# Patient Record
Sex: Female | Born: 1990 | Race: Black or African American | Hispanic: No | Marital: Single | Smoking: Current some day smoker
Health system: Southern US, Community
[De-identification: ages and names within clinical notes are randomized; demographics above are authoritative.]

## PROBLEM LIST (undated history)

## (undated) DIAGNOSIS — F329 Major depressive disorder, single episode, unspecified: Secondary | ICD-10-CM

## (undated) DIAGNOSIS — F419 Anxiety disorder, unspecified: Secondary | ICD-10-CM

## (undated) DIAGNOSIS — F4310 Post-traumatic stress disorder, unspecified: Secondary | ICD-10-CM

## (undated) DIAGNOSIS — F32A Depression, unspecified: Secondary | ICD-10-CM

## (undated) HISTORY — PX: TUBAL LIGATION: SHX77

---
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
## 2009-01-12 ENCOUNTER — Emergency Department (HOSPITAL_COMMUNITY): Admission: EM | Admit: 2009-01-12 | Discharge: 2009-01-13 | Payer: Self-pay | Admitting: Emergency Medicine

## 2009-05-30 ENCOUNTER — Emergency Department (HOSPITAL_COMMUNITY): Admission: EM | Admit: 2009-05-30 | Discharge: 2009-05-31 | Payer: Self-pay | Admitting: Emergency Medicine

## 2010-01-24 ENCOUNTER — Emergency Department (HOSPITAL_COMMUNITY)
Admission: EM | Admit: 2010-01-24 | Discharge: 2010-01-24 | Payer: Self-pay | Source: Home / Self Care | Admitting: Emergency Medicine

## 2010-04-11 ENCOUNTER — Emergency Department (HOSPITAL_COMMUNITY): Payer: Self-pay

## 2010-04-11 ENCOUNTER — Emergency Department (HOSPITAL_COMMUNITY)
Admission: EM | Admit: 2010-04-11 | Discharge: 2010-04-12 | Disposition: A | Discharge status: Home / Self Care | Payer: Self-pay | Attending: Emergency Medicine | Admitting: Emergency Medicine

## 2010-04-11 DIAGNOSIS — R109 Unspecified abdominal pain: Secondary | ICD-10-CM | POA: Insufficient documentation

## 2010-04-11 LAB — URINALYSIS, ROUTINE W REFLEX MICROSCOPIC
Ketones, ur: NEGATIVE mg/dL
Leukocytes, UA: NEGATIVE
Nitrite: NEGATIVE
Protein, ur: NEGATIVE mg/dL
pH: 7 (ref 5.0–8.0)

## 2010-04-11 LAB — POCT PREGNANCY, URINE: Preg Test, Ur: NEGATIVE

## 2010-04-11 LAB — URINE MICROSCOPIC-ADD ON

## 2010-04-27 LAB — URINALYSIS, ROUTINE W REFLEX MICROSCOPIC
Glucose, UA: NEGATIVE mg/dL
Leukocytes, UA: NEGATIVE
pH: 8 (ref 5.0–8.0)

## 2010-04-27 LAB — URINE MICROSCOPIC-ADD ON

## 2010-05-11 LAB — WET PREP, GENITAL: Trich, Wet Prep: NONE SEEN

## 2010-05-11 LAB — URINALYSIS, ROUTINE W REFLEX MICROSCOPIC
Bilirubin Urine: NEGATIVE
Protein, ur: NEGATIVE mg/dL
Urobilinogen, UA: 0.2 mg/dL (ref 0.0–1.0)

## 2010-05-11 LAB — GC/CHLAMYDIA PROBE AMP, GENITAL
Chlamydia, DNA Probe: NEGATIVE
GC Probe Amp, Genital: NEGATIVE

## 2010-11-02 LAB — DIFFERENTIAL
Eosinophils Relative: 1
Lymphocytes Relative: 17
Lymphs Abs: 1.2
Neutro Abs: 5.5
Neutrophils Relative %: 74

## 2010-11-02 LAB — BASIC METABOLIC PANEL
BUN: 11
Calcium: 9.3
GFR calc non Af Amer: >60
Potassium: 3.5
Sodium: 134 — ABNORMAL LOW

## 2010-11-02 LAB — ETHANOL: Alcohol, Ethyl (B): <5

## 2010-11-02 LAB — CBC
HCT: 38.6
Platelets: 308
WBC: 7.4

## 2010-11-02 LAB — RAPID URINE DRUG SCREEN, HOSP PERFORMED
Cocaine: NOT DETECTED
Tetrahydrocannabinol: POSITIVE — AB

## 2010-11-04 LAB — URINALYSIS, ROUTINE W REFLEX MICROSCOPIC
Leukocytes, UA: NEGATIVE
Nitrite: NEGATIVE
Specific Gravity, Urine: 1.01
Urobilinogen, UA: 0.2
pH: 7

## 2010-11-04 LAB — URINE MICROSCOPIC-ADD ON

## 2010-11-04 LAB — PREGNANCY, URINE: Preg Test, Ur: NEGATIVE

## 2010-11-08 LAB — URINALYSIS, ROUTINE W REFLEX MICROSCOPIC
Glucose, UA: NEGATIVE
Ketones, ur: NEGATIVE
Protein, ur: >300 — AB
pH: 8.5 — ABNORMAL HIGH

## 2010-11-19 LAB — URINALYSIS, ROUTINE W REFLEX MICROSCOPIC
Glucose, UA: NEGATIVE
pH: 6

## 2010-11-22 LAB — WET PREP, GENITAL
WBC, Wet Prep HPF POC: NONE SEEN
Yeast Wet Prep HPF POC: NONE SEEN

## 2010-11-22 LAB — PREGNANCY, URINE: Preg Test, Ur: NEGATIVE

## 2010-11-22 LAB — URINALYSIS, ROUTINE W REFLEX MICROSCOPIC
Bilirubin Urine: NEGATIVE
Nitrite: NEGATIVE
Specific Gravity, Urine: 1.015
Urobilinogen, UA: 0.2

## 2010-11-22 LAB — URINE MICROSCOPIC-ADD ON

## 2011-08-03 ENCOUNTER — Emergency Department (HOSPITAL_COMMUNITY)
Admission: EM | Admit: 2011-08-03 | Discharge: 2011-08-03 | Disposition: A | Discharge status: Home / Self Care | Payer: Medicaid Other | Attending: Emergency Medicine | Admitting: Emergency Medicine

## 2011-08-03 ENCOUNTER — Encounter (HOSPITAL_COMMUNITY): Payer: Self-pay | Admitting: *Deleted

## 2011-08-03 DIAGNOSIS — B977 Papillomavirus as the cause of diseases classified elsewhere: Secondary | ICD-10-CM | POA: Insufficient documentation

## 2011-08-03 DIAGNOSIS — F17200 Nicotine dependence, unspecified, uncomplicated: Secondary | ICD-10-CM | POA: Insufficient documentation

## 2011-08-03 NOTE — ED Notes (Signed)
Vag d/c with odor, headache.

## 2011-08-03 NOTE — Discharge Instructions (Signed)
Human Papillomavirus  Human papillomavirus (HPV) is the most common sexually transmitted infection (STI) and is highly contagious. HPV infections cause genital warts and cancers to the outlet of the womb (cervix), birth canal (vagina), opening of the birth canal (vulva), and anus. There are over 100 types of HPV. Four types of HPV are responsible for causing 70% of all cervical cancers. Ninety percent of anal cancers and genital warts are caused by HPV. Unless you have wart-like lesions in the throat or genital warts that you can see or feel, HPV usually does not cause symptoms. Therefore, people can be infected for long periods and pass it on to others without knowing it.  HPV in pregnancy usually does not cause a problem for the mother or baby. If the mother has genital warts, the baby rarely gets infected. When the HPV infection is found to be pre-cancerous on the cervix, vagina, or vulva, the mother will be followed closely during the pregnancy. Any needed treatment will be done after the baby is born.  CAUSES     Having unprotected sex. HPV can be spread by oral, vaginal, or anal sexual activity.   Having several sex partners.   Having a sex partner who has other sex partners.   Having or having had another sexually transmitted infection.  SYMPTOMS     More than 90% of people carrying HPV cannot tell anything is wrong.   Wart-like lesions in the throat (from having oral sex).   Warts in the infected skin or mucous membranes.   Genital warts may itch, burn, or bleed.   Genital warts may be painful or bleed during sexual intercourse.  DIAGNOSIS     Genital warts are easily seen with the naked eye.   Currently, there is no FDA-approved test to detect HPV in males.   In females, a Pap test can show cells which are infected with HPV.   In females, a scope can be used to view the cervix (colposcopy). A colposcopy can be performed if the pelvic exam or Pap test is abnormal.    In females, a sample of tissue may be removed (biopsy) during the colposcopy.  TREATMENT     Treatment of genital warts can include:   Podophyllin lotion or gel.   Bichloroacetic acid (BCA) or trichloroacetic acid (TCA).   Podofilox solution or gel.   Imiquimod cream.   Interferon injections.   Use of a probe to apply extreme cold (cryotherapy).   Application of an intense beam of light (laser treatment).   Use of a probe to apply extreme heat (electrocautery).   Surgery.   HPV of the cervix, vagina, or vulva can be treated with:   Cryotherapy.   Laser treatment.   Electrocautery.   Surgery.  Your caregiver will follow you closely after you are treated. This is because the HPV can come back and may need treatment again.  HOME CARE INSTRUCTIONS     Follow your caregiver's instructions regarding medications, Pap tests, and follow-up exams.   Do not touch or scratch the warts.   Do not treat genital warts with medication used for treating hand warts.   Tell your sex partner about your infection because he or she may also need treatment.   Do not have sex while you are being treated.   After treatment, use condoms during sex to prevent future infections.   Have only 1 sex partner.   Have a sex partner who does not have other sex partners.     Use over-the-counter creams for itching or irritation as directed by your caregiver.   Use over-the-counter or prescription medicines for pain, discomfort, or fever as directed by your caregiver.   Do not douche or use tampons during treatment of HPV.  PREVENTION     Talk to your caregiver about getting the HPV vaccines. These vaccines prevent some HPV infections and cancers. It is recommended that the vaccine be given to males and females between the age of 9 and 26 years old. It will not work if you already have HPV and it is not recommended for pregnant women. The vaccines are not recommended for pregnant women.    Call your caregiver if you think you are pregnant and have the HPV.   A PAP test is done to screen for cervical cancer.   The first PAP test should be done at age 21.   Between ages 21 and 29, PAP tests are repeated every 2 years.   Beginning at age 30, you are advised to have a PAP test every 3 years as long as your past 3 PAP tests have been normal.   Some women have medical problems that increase the chance of getting cervical cancer. Talk to your caregiver about these problems. It is especially important to talk to your caregiver if a new problem develops soon after your last PAP test. In these cases, your caregiver may recommend more frequent screening and Pap tests.   The above recommendations are the same for women who have or have not gotten the vaccine for HPV (Human Papillomavirus).   If you had a hysterectomy for a problem that was not a cancer or a condition that could lead to cancer, then you no longer need Pap tests. However, even if you no longer need a PAP test, a regular exam is a good idea to make sure no other problems are starting.      If you are between ages 65 and 70, and you have had normal Pap tests going back 10 years, you no longer need Pap tests. However, even if you no longer need a PAP test, a regular exam is a good idea to make sure no other problems are starting.   If you have had past treatment for cervical cancer or a condition that could lead to cancer, you need Pap tests and screening for cancer for at least 20 years after your treatment.    If Pap tests have been discontinued, risk factors (such as a new sexual partner) need to be re-assessed to determine if screening should be resumed.   Some women may need screenings more often if they are at high risk for cervical cancer.  SEEK MEDICAL CARE IF:     The treated skin becomes red, swollen or painful.   You have an oral temperature above 102 F (38.9 C).   You feel generally ill.    You feel lumps or pimple-like projections in and around your genital area.   You develop bleeding of the vagina or the treatment area.   You develop painful sexual intercourse.  Document Released: 04/16/2003 Document Revised: 01/13/2011 Document Reviewed: 04/05/2007  ExitCare Patient Information 2012 [employer].

## 2011-08-05 NOTE — ED Provider Notes (Signed)
History     CSN: [id number]  Arrival date & time 08/03/11  1641   First MD Initiated Contact with Patient 08/03/11 1655      No chief complaint on file.   (Consider location/radiation/quality/duration/timing/severity/associated sxs/prior treatment) HPI Comments: Mercedes Reynolds presents for assistance with "bumps" she has developed on her genitals over the past month.  She is currently 2 months post partum.  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  She has concerns about this infection and would like to be treated for this.  She has not yet discussed this diagnosis with her gynecologist,  Was just told this result over the phone.  She denies having any vaginal pain or discharge,  Also denies painful urination,  Fevers,  Chills or abdominal pain.  She does report occasional headache.  She is not currently breastfeeding.    The history is provided by the patient.    History reviewed. No pertinent past medical history.  History reviewed. No pertinent past surgical history.  History reviewed. No pertinent family history.  History  Substance Use Topics  . Smoking status: Current Everyday Smoker  . Smokeless tobacco: Not on file  . Alcohol Use: Yes    OB History    Grav Para Term Preterm Abortions TAB SAB Ect Mult Living                  Review of Systems  Constitutional: Negative for fever.  HENT: Negative for congestion, sore throat and neck pain.   Eyes: Negative.   Respiratory: Negative for chest tightness and shortness of breath.   Cardiovascular: Negative for chest pain.  Gastrointestinal: Negative for nausea and abdominal pain.  Genitourinary: Negative.  Negative for dysuria, vaginal discharge and vaginal pain.  Musculoskeletal: Negative for joint swelling and arthralgias.  Skin: Positive for rash. Negative for wound.  Neurological: Negative for  dizziness, weakness, light-headedness, numbness and headaches.  Hematological: Negative.   Psychiatric/Behavioral: Negative.     Allergies  Review of patient's allergies indicates no known allergies.  Home Medications  No current outpatient prescriptions on file.  BP 120/75  Pulse 90  Temp 97.9 F (36.6 C) (Oral)  Resp 20  Ht 5' 3.5" (1.613 m)  Wt 122 lb (55.339 kg)  BMI 21.27 kg/m2  SpO2 100%  LMP 07/26/2011  Physical Exam  Constitutional: She appears well-developed and well-nourished. No distress.  HENT:  Head: Normocephalic.  Neck: Neck supple.  Cardiovascular: Normal rate.   Pulmonary/Chest: Effort normal.  Abdominal: Soft. She exhibits no distension. There is no tenderness.  Genitourinary:       Speculum exam deferred.  Several raised,  Flesh colored lesions on labia majora consistent with HPV.  Musculoskeletal: She exhibits no edema.    ED Course  Procedures (including critical care time)  Labs Reviewed - No data to display No results found.   1. HPV (human papilloma virus) infection     Nursing notes reviewed.  Pt denies vaginal dc as symptom today.  MDM  Pt basically here for information regarding her recent diagnosis of HPV. No labs or further testing deemed necessary at this time.  Time was spent with patient educating regarding her diagnosis and options for treatment.  She was encouraged to f/u with her gynecologist for further assistance with tx options.  She will call for appt.  Burgess Amor, [place] 08/05/11 1539

## 2011-08-15 NOTE — ED Provider Notes (Signed)
Medical screening examination/treatment/procedure(s) were performed by non-physician practitioner and as supervising physician I was immediately available for consultation/collaboration.  Donnetta Hutching, MD 08/15/11 [phone]

## 2013-07-25 ENCOUNTER — Emergency Department (HOSPITAL_COMMUNITY)
Admission: EM | Admit: 2013-07-25 | Discharge: 2013-07-25 | Disposition: A | Discharge status: Home / Self Care | Payer: Medicaid Other | Attending: Emergency Medicine | Admitting: Emergency Medicine

## 2013-07-25 ENCOUNTER — Encounter (HOSPITAL_COMMUNITY): Payer: Self-pay | Admitting: Emergency Medicine

## 2013-07-25 DIAGNOSIS — Z202 Contact with and (suspected) exposure to infections with a predominantly sexual mode of transmission: Secondary | ICD-10-CM

## 2013-07-25 DIAGNOSIS — Z113 Encounter for screening for infections with a predominantly sexual mode of transmission: Secondary | ICD-10-CM | POA: Insufficient documentation

## 2013-07-25 DIAGNOSIS — N390 Urinary tract infection, site not specified: Secondary | ICD-10-CM | POA: Insufficient documentation

## 2013-07-25 DIAGNOSIS — F411 Generalized anxiety disorder: Secondary | ICD-10-CM | POA: Insufficient documentation

## 2013-07-25 DIAGNOSIS — F17200 Nicotine dependence, unspecified, uncomplicated: Secondary | ICD-10-CM | POA: Insufficient documentation

## 2013-07-25 HISTORY — DX: Anxiety disorder, unspecified: F41.9

## 2013-07-25 LAB — URINALYSIS, ROUTINE W REFLEX MICROSCOPIC
BILIRUBIN URINE: NEGATIVE
GLUCOSE, UA: NEGATIVE mg/dL
Ketones, ur: NEGATIVE mg/dL
Nitrite: NEGATIVE
PROTEIN: 100 mg/dL — AB
SPECIFIC GRAVITY, URINE: 1.025 (ref 1.005–1.030)
Urobilinogen, UA: 1 mg/dL (ref 0.0–1.0)
pH: 6.5 (ref 5.0–8.0)

## 2013-07-25 LAB — URINE MICROSCOPIC-ADD ON

## 2013-07-25 MED ORDER — PHENAZOPYRIDINE HCL 100 MG PO TABS: 100.0000 mg | ORAL_TABLET | Freq: Three times a day (TID) | ORAL | Status: DC

## 2013-07-25 MED ORDER — CEPHALEXIN 500 MG PO CAPS
500.0000 mg | ORAL_CAPSULE | Freq: Once | ORAL | Status: AC
  Administered 2013-07-25: 500 mg via ORAL
  Filled 2013-07-25: qty 1

## 2013-07-25 MED ORDER — ONDANSETRON HCL 4 MG PO TABS
4.0000 mg | ORAL_TABLET | Freq: Once | ORAL | Status: AC
  Administered 2013-07-25: 4 mg via ORAL
  Filled 2013-07-25: qty 1

## 2013-07-25 MED ORDER — CEPHALEXIN 500 MG PO CAPS: 500.0000 mg | ORAL_CAPSULE | Freq: Four times a day (QID) | ORAL | Status: DC

## 2013-07-25 MED ORDER — PHENAZOPYRIDINE HCL 100 MG PO TABS
200.0000 mg | ORAL_TABLET | Freq: Once | ORAL | Status: AC
  Administered 2013-07-25: 200 mg via ORAL
  Filled 2013-07-25: qty 2

## 2013-07-25 NOTE — ED Provider Notes (Signed)
CSN: [id number]     Arrival date & time 07/25/13  1813 History   First MD Initiated Contact with Patient 07/25/13 1837     Chief Complaint  Patient presents with  . Hematuria     (Consider location/radiation/quality/duration/timing/severity/associated sxs/prior Treatment) Patient is a 23 y.o. female presenting with hematuria. The history is provided by the patient.  Hematuria This is a new problem. The current episode started in the past 7 days. The problem occurs intermittently. The problem has been unchanged. Associated symptoms include abdominal pain and urinary symptoms. Pertinent negatives include no arthralgias, chest pain, chills, coughing, nausea, neck pain or vomiting. Nothing aggravates the symptoms. She has tried nothing for the symptoms. The treatment provided no relief.    Past Medical History  Diagnosis Date  . Anxiety    History reviewed. No pertinent past surgical history. Family History  Problem Relation Age of Onset  . Stroke Other   . Diabetes Other    History  Substance Use Topics  . Smoking status: Current Every Day Smoker -- 0.35 packs/day for 6 years    Types: Cigarettes  . Smokeless tobacco: Never Used  . Alcohol Use: Yes   OB History   Grav Para Term Preterm Abortions TAB SAB Ect Mult Living   2 2 2       2      Review of Systems  Constitutional: Negative for chills and activity change.       All ROS Neg except as noted in HPI  HENT: Negative for nosebleeds.   Eyes: Negative for photophobia and discharge.  Respiratory: Negative for cough, shortness of breath and wheezing.   Cardiovascular: Negative for chest pain and palpitations.  Gastrointestinal: Positive for abdominal pain. Negative for nausea, vomiting and blood in stool.  Genitourinary: Positive for dysuria and hematuria. Negative for frequency.  Musculoskeletal: Negative for arthralgias, back pain and neck pain.  Skin: Negative.   Neurological: Negative for dizziness, seizures and speech  difficulty.  Psychiatric/Behavioral: Negative for hallucinations and confusion. The patient is nervous/anxious.       Allergies  Review of patient's allergies indicates no known allergies.  Home Medications   Prior to Admission medications   Not on File   BP 120/71  Pulse 61  Temp(Src) 98.3 F (36.8 C) (Oral)  Resp 18  Ht 5' 3.5" (1.613 m)  Wt 106 lb 6.4 oz (48.263 kg)  BMI 18.55 kg/m2  SpO2 100%  LMP 07/21/2013 Physical Exam  Nursing note and vitals reviewed. Constitutional: She is oriented to person, place, and time. She appears well-developed and well-nourished.  Non-toxic appearance.  HENT:  Head: Normocephalic.  Right Ear: Tympanic membrane and external ear normal.  Left Ear: Tympanic membrane and external ear normal.  Eyes: EOM and lids are normal. Pupils are equal, round, and reactive to light.  Neck: Normal range of motion. Neck supple. Carotid bruit is not present.  Cardiovascular: Normal rate, regular rhythm, normal heart sounds, intact distal pulses and normal pulses.   Pulmonary/Chest: Breath sounds normal. No respiratory distress.  Abdominal: Soft. Bowel sounds are normal. There is tenderness in the suprapubic area. There is no guarding and no CVA tenderness.    Musculoskeletal: Normal range of motion.  Lymphadenopathy:       Head (right side): No submandibular adenopathy present.       Head (left side): No submandibular adenopathy present.    She has no cervical adenopathy.  Neurological: She is alert and oriented to person, place, and time. She has  normal strength. No cranial nerve deficit or sensory deficit.  Skin: Skin is warm and dry.  Psychiatric: She has a normal mood and affect. Her speech is normal.    ED Course  Procedures (including critical care time) Labs Review Labs Reviewed  URINALYSIS, ROUTINE W REFLEX MICROSCOPIC - Abnormal; Notable for the following:    Hgb urine dipstick LARGE (*)    Protein, ur 100 (*)    Leukocytes, UA SMALL (*)     All other components within normal limits  URINE MICROSCOPIC-ADD ON - Abnormal; Notable for the following:    Squamous Epithelial / LPF MANY (*)    Bacteria, UA FEW (*)    All other components within normal limits  GC/CHLAMYDIA PROBE AMP  HIV ANTIBODY (ROUTINE TESTING)  RPR    Imaging Review No results found.   EKG Interpretation None      MDM Urinalysis reveals a clear yellow specimen with a specific gravity 1.025. There is a large hemoglobin and 100 mg per decaliter of protein present. There is a small leukocyte esterase. There are too many to count white blood cells and 21-50 red blood cells per high-powered field. There are many epithelial cells present. The urine will be sent for culture.  The patient requests to have her urine tested for sexually transmitted diseases and also to have a battery of sexually transmitted disease testing done. The patient had blood sent to the lab for HIV, RPR. The lab will also test the urine for gonorrhea and Chlamydia.  The plan at this time is for the patient to be treated with Keflex 500 mg 4 times a day and a urinary analgesic.    Final diagnoses:  None    *I have reviewed nursing notes, vital signs, and all appropriate lab and imaging results for this patient.Kathie Dike**    Hobson M Bryant, PA-C 07/25/13 1929

## 2013-07-25 NOTE — ED Provider Notes (Signed)
Medical screening examination/treatment/procedure(s) were performed by non-physician practitioner and as supervising physician I was immediately available for consultation/collaboration.   EKG Interpretation None      Devoria AlbeIva Knapp, MD, Armando GangFACEP   Ward GivensIva L Knapp, MD 07/25/13 [phone]

## 2013-07-25 NOTE — ED Notes (Signed)
Please call any lab results to [phone].

## 2013-07-25 NOTE — Discharge Instructions (Signed)

## 2013-07-25 NOTE — ED Notes (Signed)
Per patient used a douche after period on Monday. Patient states since then urine dark in color, with foul odor. Patient reports today frequency with little urination and noting blood today after wiping. Patient denies any pain but states "It feels like I'm going to have pain when I stop urinating but it does not."

## 2013-07-26 LAB — RPR

## 2013-07-26 LAB — HIV ANTIBODY (ROUTINE TESTING W REFLEX): HIV 1&2 Ab, 4th Generation: NONREACTIVE

## 2013-07-27 LAB — GC/CHLAMYDIA PROBE AMP
CT PROBE, AMP APTIMA: NEGATIVE
GC PROBE AMP APTIMA: NEGATIVE

## 2013-07-28 LAB — URINE CULTURE
Colony Count: 50000
SPECIAL REQUESTS: NORMAL

## 2013-07-29 ENCOUNTER — Telehealth (HOSPITAL_BASED_OUTPATIENT_CLINIC_OR_DEPARTMENT_OTHER): Payer: Self-pay | Admitting: Emergency Medicine

## 2013-07-29 NOTE — Progress Notes (Signed)
ED Antimicrobial Stewardship Positive Culture Follow Up   Mercedes Reynolds is an 23 y.o. female who [facility] with a chief complaint of  Chief Complaint  Patient presents with  . Hematuria    Recent Results (from the past 720 hour(s))  GC/CHLAMYDIA PROBE AMP     Status: None   Collection Time    07/25/13  6:50 PM      Result Value Ref Range Status   CT Probe RNA NEGATIVE  NEGATIVE Final   GC Probe RNA NEGATIVE  NEGATIVE Final   Comment: (NOTE)                                                                                               **Normal Reference Range: Negative**          Assay performed using the Gen-Probe APTIMA COMBO2 (R) Assay.     Acceptable specimen types for this assay include APTIMA Swabs (Unisex,     endocervical, urethral, or vaginal), first void urine, and ThinPrep     liquid based cytology samples.     Performed at [facility]  URINE CULTURE     Status: None   Collection Time    07/25/13  7:36 PM      Result Value Ref Range Status   Specimen Description URINE, CLEAN CATCH   Final   Special Requests Normal   Final   Culture  Setup Time     Final   Value: 07/26/2013 14:24     Performed at [facility]   Colony Count     Final   Value: 50,000 COLONIES/ML     Performed at [facility]   Culture     Final   Value: ESCHERICHIA COLI     Performed at [facility]   Report Status 07/28/2013 FINAL   Final   Organism ID, Bacteria ESCHERICHIA COLI   Final    [x]  Treated with Cephalexin, organism may be resistant to prescribed antimicrobial []  Patient discharged originally without antimicrobial agent and treatment is now indicated  Recommendation: Perform symptom check. If symptoms improving, complete Cephalexin therapy. If symptoms persist/worsening, start Ciprofloxacin 500mg  PO BID x 3 days.  ED Provider: Fayrene HelperBowie Tran, PA-C   Cleon DewDulaney, Clarence Robert 07/29/2013, 3:43 PM Infectious Diseases  Pharmacist Phone# [phone]

## 2013-12-09 ENCOUNTER — Encounter (HOSPITAL_COMMUNITY): Payer: Self-pay | Admitting: Emergency Medicine

## 2013-12-19 ENCOUNTER — Encounter (HOSPITAL_COMMUNITY): Payer: Self-pay | Admitting: *Deleted

## 2013-12-19 ENCOUNTER — Emergency Department (HOSPITAL_COMMUNITY)
Admission: EM | Admit: 2013-12-19 | Discharge: 2013-12-19 | Disposition: A | Discharge status: Home / Self Care | Payer: Medicaid Other | Attending: Emergency Medicine | Admitting: Emergency Medicine

## 2013-12-19 DIAGNOSIS — R110 Nausea: Secondary | ICD-10-CM | POA: Insufficient documentation

## 2013-12-19 DIAGNOSIS — Z8659 Personal history of other mental and behavioral disorders: Secondary | ICD-10-CM | POA: Insufficient documentation

## 2013-12-19 DIAGNOSIS — B9689 Other specified bacterial agents as the cause of diseases classified elsewhere: Secondary | ICD-10-CM

## 2013-12-19 DIAGNOSIS — Z720 Tobacco use: Secondary | ICD-10-CM | POA: Insufficient documentation

## 2013-12-19 DIAGNOSIS — N390 Urinary tract infection, site not specified: Secondary | ICD-10-CM

## 2013-12-19 DIAGNOSIS — Z3202 Encounter for pregnancy test, result negative: Secondary | ICD-10-CM | POA: Insufficient documentation

## 2013-12-19 DIAGNOSIS — N760 Acute vaginitis: Secondary | ICD-10-CM | POA: Insufficient documentation

## 2013-12-19 HISTORY — DX: Major depressive disorder, single episode, unspecified: F32.9

## 2013-12-19 HISTORY — DX: Depression, unspecified: F32.A

## 2013-12-19 LAB — WET PREP, GENITAL
Trich, Wet Prep: NONE SEEN
Yeast Wet Prep HPF POC: NONE SEEN

## 2013-12-19 LAB — URINE MICROSCOPIC-ADD ON

## 2013-12-19 LAB — URINALYSIS, ROUTINE W REFLEX MICROSCOPIC
Bilirubin Urine: NEGATIVE
Glucose, UA: NEGATIVE mg/dL
Hgb urine dipstick: NEGATIVE
Ketones, ur: NEGATIVE mg/dL
NITRITE: NEGATIVE
PH: 7 (ref 5.0–8.0)
Protein, ur: NEGATIVE mg/dL
SPECIFIC GRAVITY, URINE: 1.01 (ref 1.005–1.030)
Urobilinogen, UA: 0.2 mg/dL (ref 0.0–1.0)

## 2013-12-19 LAB — POC URINE PREG, ED: PREG TEST UR: NEGATIVE

## 2013-12-19 MED ORDER — ONDANSETRON HCL 4 MG PO TABS
4.0000 mg | ORAL_TABLET | Freq: Once | ORAL | Status: AC
  Administered 2013-12-19: 4 mg via ORAL
  Filled 2013-12-19: qty 1

## 2013-12-19 MED ORDER — CEPHALEXIN 500 MG PO CAPS
500.0000 mg | ORAL_CAPSULE | Freq: Once | ORAL | Status: AC
  Administered 2013-12-19: 500 mg via ORAL
  Filled 2013-12-19: qty 1

## 2013-12-19 MED ORDER — METRONIDAZOLE 500 MG PO TABS
500.0000 mg | ORAL_TABLET | Freq: Once | ORAL | Status: AC
Start: 2013-12-19 — End: 2013-12-19
  Administered 2013-12-19: 500 mg via ORAL
  Filled 2013-12-19: qty 1

## 2013-12-19 MED ORDER — METRONIDAZOLE 500 MG PO TABS
500.0000 mg | ORAL_TABLET | Freq: Two times a day (BID) | ORAL | Status: DC
Start: 2013-12-19 — End: 2015-03-24

## 2013-12-19 MED ORDER — CEPHALEXIN 500 MG PO CAPS: 500.0000 mg | ORAL_CAPSULE | Freq: Four times a day (QID) | ORAL | Status: DC

## 2013-12-19 NOTE — ED Notes (Addendum)
Vaginal d/c with odor  For 1 month   Painful intercourse

## 2013-12-19 NOTE — Discharge Instructions (Signed)
Bacterial Vaginosis °Bacterial vaginosis is a vaginal infection that occurs when the normal balance of bacteria in the vagina is disrupted. It results from an overgrowth of certain bacteria. This is the most common vaginal infection in women of childbearing age. Treatment is important to prevent complications, especially in pregnant women, as it can cause a premature delivery. °CAUSES  °Bacterial vaginosis is caused by an increase in harmful bacteria that are normally present in smaller amounts in the vagina. Several different kinds of bacteria can cause bacterial vaginosis. However, the reason that the condition develops is not fully understood. °RISK FACTORS °Certain activities or behaviors can put you at an increased risk of developing bacterial vaginosis, including: °· Having a new sex partner or multiple sex partners. °· Douching. °· Using an intrauterine device (IUD) for contraception. °Women do not get bacterial vaginosis from toilet seats, bedding, swimming pools, or contact with objects around them. °SIGNS AND SYMPTOMS  °Some women with bacterial vaginosis have no signs or symptoms. Common symptoms include: °· Grey vaginal discharge. °· A fishlike odor with discharge, especially after sexual intercourse. °· Itching or burning of the vagina and vulva. °· Burning or pain with urination. °DIAGNOSIS  °Your health care provider will take a medical history and examine the vagina for signs of bacterial vaginosis. A sample of vaginal fluid may be taken. Your health care provider will look at this sample under a microscope to check for bacteria and abnormal cells. A vaginal pH test may also be done.  °TREATMENT  °Bacterial vaginosis may be treated with antibiotic medicines. These may be given in the form of a pill or a vaginal cream. A second round of antibiotics may be prescribed if the condition comes back after treatment.  °HOME CARE INSTRUCTIONS  °· Only take over-the-counter or prescription medicines as  directed by your health care provider. °· If antibiotic medicine was prescribed, take it as directed. Make sure you finish it even if you start to feel better. °· Do not have sex until treatment is completed. °· Tell all sexual partners that you have a vaginal infection. They should see their health care provider and be treated if they have problems, such as a mild rash or itching. °· Practice safe sex by using condoms and only having one sex partner. °SEEK MEDICAL CARE IF:  °· Your symptoms are not improving after 3 days of treatment. °· You have increased discharge or pain. °· You have a fever. °MAKE SURE YOU:  °· Understand these instructions. °· Will watch your condition. °· Will get help right away if you are not doing well or get worse. °FOR MORE INFORMATION  °Centers for Disease Control and Prevention, Division of STD Prevention: www.cdc.gov/std °American Sexual Health Association (ASHA): www.ashastd.org  °Document Released: 01/24/2005 Document Revised: 11/14/2012 Document Reviewed: 09/05/2012 °ExitCare® Patient Information ©2015 [employer]. This information is not intended to replace advice given to you by your health care provider. Make sure you discuss any questions you have with your health care provider. ° °Urinary Tract Infection °A urinary tract infection (UTI) can occur any place along the urinary tract. The tract includes the kidneys, ureters, bladder, and urethra. A type of germ called bacteria often causes a UTI. UTIs are often helped with antibiotic medicine.  °HOME CARE  °· If given, take antibiotics as told by your doctor. Finish them even if you start to feel better. °· Drink enough fluids to keep your pee (urine) clear or pale yellow. °· Avoid tea, drinks with caffeine,   and bubbly (carbonated) drinks. °· Pee often. Avoid holding your pee in for a long time. °· Pee before and after having sex (intercourse). °· Wipe from front to back after you poop (bowel movement) if you are a woman. Use  each tissue only once. °GET HELP RIGHT AWAY IF:  °· You have back pain. °· You have lower belly (abdominal) pain. °· You have chills. °· You feel sick to your stomach (nauseous). °· You throw up (vomit). °· Your burning or discomfort with peeing does not go away. °· You have a fever. °· Your symptoms are not better in 3 days. °MAKE SURE YOU:  °· Understand these instructions. °· Will watch your condition. °· Will get help right away if you are not doing well or get worse. °Document Released: 07/13/2007 Document Revised: 10/19/2011 Document Reviewed: 08/25/2011 °ExitCare® Patient Information ©2015 [employer]. This information is not intended to replace advice given to you by your health care provider. Make sure you discuss any questions you have with your health care provider. ° °

## 2013-12-19 NOTE — ED Provider Notes (Signed)
CSN: [id number]     Arrival date & time 12/19/13  1813 History   First MD Initiated Contact with Patient 12/19/13 1925     Chief Complaint  Patient presents with  . Vaginal Discharge     (Consider location/radiation/quality/duration/timing/severity/associated sxs/prior Treatment) Patient is a 23 y.o. female presenting with vaginal discharge. The history is provided by the patient.  Vaginal Discharge Quality:  White Severity:  Moderate Onset quality:  Gradual Duration:  1 month Timing:  Intermittent Progression:  Worsening Chronicity:  Recurrent Context: not recent antibiotic use   Relieved by:  Nothing Ineffective treatments:  None tried Associated symptoms: dyspareunia and nausea   Associated symptoms: no abdominal pain, no dysuria, no fever, no urinary incontinence, no vaginal itching and no vomiting   Risk factors: no immunosuppression     Past Medical History  Diagnosis Date  . Anxiety   . Depression    History reviewed. No pertinent past surgical history. Family History  Problem Relation Age of Onset  . Stroke Other   . Diabetes Other    History  Substance Use Topics  . Smoking status: Current Every Day Smoker -- 0.35 packs/day for 6 years    Types: Cigarettes  . Smokeless tobacco: Never Used  . Alcohol Use: Yes   OB History    Gravida Para Term Preterm AB TAB SAB Ectopic Multiple Living   2 2 2       2      Review of Systems  Constitutional: Negative for fever and activity change.       All ROS Neg except as noted in HPI  Eyes: Negative for photophobia and discharge.  Respiratory: Negative for cough, shortness of breath and wheezing.   Cardiovascular: Negative for chest pain and palpitations.  Gastrointestinal: Positive for nausea. Negative for vomiting, abdominal pain and blood in stool.  Genitourinary: Positive for vaginal discharge and dyspareunia. Negative for bladder incontinence, dysuria, frequency and hematuria.  Musculoskeletal: Negative for  back pain, arthralgias and neck pain.  Skin: Negative.   Neurological: Negative for dizziness, seizures and speech difficulty.  Psychiatric/Behavioral: Negative for hallucinations and confusion.      Allergies  Review of patient's allergies indicates no known allergies.  Home Medications   Prior to Admission medications   Medication Sig Start Date End Date Taking? Authorizing Provider  cephALEXin (KEFLEX) 500 MG capsule Take 1 capsule (500 mg total) by mouth 4 (four) times daily. Patient not taking: Reported on 12/19/2013 07/25/13   Kathie DikeHobson M Alianny Toelle, PA-C  phenazopyridine (PYRIDIUM) 100 MG tablet Take 1 tablet (100 mg total) by mouth 3 (three) times daily. Patient not taking: Reported on 12/19/2013 07/25/13   Kathie DikeHobson M Shadrick Senne, PA-C   BP 131/81 mmHg  Pulse 77  Temp(Src) 98.4 F (36.9 C) (Oral)  Resp 18  Ht 5\' 3"  (1.6 m)  Wt 106 lb (48.081 kg)  BMI 18.78 kg/m2  SpO2 100%  LMP 11/19/2013 Physical Exam  Constitutional: She is oriented to person, place, and time. She appears well-developed and well-nourished.  Non-toxic appearance.  HENT:  Head: Normocephalic.  Right Ear: Tympanic membrane and external ear normal.  Left Ear: Tympanic membrane and external ear normal.  Eyes: EOM and lids are normal. Pupils are equal, round, and reactive to light.  Neck: Normal range of motion. Neck supple. Carotid bruit is not present.  Cardiovascular: Normal rate, regular rhythm, normal heart sounds, intact distal pulses and normal pulses.   Pulmonary/Chest: Breath sounds normal. No respiratory distress.  Abdominal: Soft. Bowel sounds  are normal. There is no tenderness. There is no guarding. Hernia confirmed negative in the right inguinal area and confirmed negative in the left inguinal area.  Genitourinary: There is no rash, tenderness or lesion on the right labia. There is no rash, tenderness or lesion on the left labia. Cervix exhibits discharge and friability. Right adnexum displays no mass and no  tenderness. Left adnexum displays no mass and no tenderness. No signs of injury around the vagina. Vaginal discharge found.  Musculoskeletal: Normal range of motion.  Lymphadenopathy:       Head (right side): No submandibular adenopathy present.       Head (left side): No submandibular adenopathy present.    She has no cervical adenopathy.       Right: No inguinal adenopathy present.       Left: No inguinal adenopathy present.  Neurological: She is alert and oriented to person, place, and time. She has normal strength. No cranial nerve deficit or sensory deficit.  Skin: Skin is warm and dry.  Psychiatric: She has a normal mood and affect. Her speech is normal.  Nursing note and vitals reviewed.   ED Course  Procedures (including critical care time) Labs Review Labs Reviewed - No data to display  Imaging Review No results found.   EKG Interpretation None      MDM  UA reveals uti.  Wet prep  Suggest b. Vaginosis. Plan -  Rx for keflex, flagyl, and pyridium. STD panel sent to lab. [facility]   Final diagnoses:  Bacterial vaginosis  UTI (lower urinary tract infection)    *I have reviewed nursing notes, vital signs, and all appropriate lab and imaging results for this patient.[address]**    Laiyla Slagel M Petar Mucci, PA-C 12/21/13 1635  Benny LennertJoseph L Zammit, MD 12/22/13 0900

## 2013-12-20 LAB — HIV ANTIBODY (ROUTINE TESTING W REFLEX): HIV 1&2 Ab, 4th Generation: NONREACTIVE

## 2013-12-20 LAB — RPR

## 2013-12-21 LAB — URINE CULTURE
CULTURE: NO GROWTH
Colony Count: NO GROWTH
Special Requests: NORMAL

## 2013-12-21 LAB — GC/CHLAMYDIA PROBE AMP
CT PROBE, AMP APTIMA: NEGATIVE
GC Probe RNA: NEGATIVE

## 2015-03-24 ENCOUNTER — Emergency Department (HOSPITAL_COMMUNITY)
Admission: EM | Admit: 2015-03-24 | Discharge: 2015-03-24 | Disposition: A | Discharge status: Home / Self Care | Payer: Medicaid Other | Attending: Emergency Medicine | Admitting: Emergency Medicine

## 2015-03-24 ENCOUNTER — Encounter (HOSPITAL_COMMUNITY): Payer: Self-pay | Admitting: *Deleted

## 2015-03-24 DIAGNOSIS — N390 Urinary tract infection, site not specified: Secondary | ICD-10-CM | POA: Diagnosis not present

## 2015-03-24 DIAGNOSIS — Z3202 Encounter for pregnancy test, result negative: Secondary | ICD-10-CM | POA: Diagnosis not present

## 2015-03-24 DIAGNOSIS — M545 Low back pain: Secondary | ICD-10-CM | POA: Diagnosis present

## 2015-03-24 DIAGNOSIS — Z8659 Personal history of other mental and behavioral disorders: Secondary | ICD-10-CM | POA: Insufficient documentation

## 2015-03-24 DIAGNOSIS — F17210 Nicotine dependence, cigarettes, uncomplicated: Secondary | ICD-10-CM | POA: Diagnosis not present

## 2015-03-24 DIAGNOSIS — Z792 Long term (current) use of antibiotics: Secondary | ICD-10-CM | POA: Diagnosis not present

## 2015-03-24 LAB — WET PREP, GENITAL
Sperm: NONE SEEN
Trich, Wet Prep: NONE SEEN
YEAST WET PREP: NONE SEEN

## 2015-03-24 LAB — URINALYSIS, ROUTINE W REFLEX MICROSCOPIC
Bilirubin Urine: NEGATIVE
Glucose, UA: NEGATIVE mg/dL
Ketones, ur: NEGATIVE mg/dL
NITRITE: NEGATIVE
Protein, ur: NEGATIVE mg/dL
Specific Gravity, Urine: 1.01 (ref 1.005–1.030)
pH: 6.5 (ref 5.0–8.0)

## 2015-03-24 LAB — URINE MICROSCOPIC-ADD ON

## 2015-03-24 LAB — PREGNANCY, URINE: PREG TEST UR: NEGATIVE

## 2015-03-24 MED ORDER — ONDANSETRON 4 MG PO TBDP
4.0000 mg | ORAL_TABLET | Freq: Once | ORAL | Status: AC
  Administered 2015-03-24: 4 mg via ORAL
  Filled 2015-03-24: qty 1

## 2015-03-24 MED ORDER — IBUPROFEN 800 MG PO TABS
800.0000 mg | ORAL_TABLET | Freq: Once | ORAL | Status: AC
  Administered 2015-03-24: 800 mg via ORAL
  Filled 2015-03-24: qty 1

## 2015-03-24 MED ORDER — CEPHALEXIN 500 MG PO CAPS
500.0000 mg | ORAL_CAPSULE | Freq: Once | ORAL | Status: AC
  Administered 2015-03-24: 500 mg via ORAL
  Filled 2015-03-24: qty 1

## 2015-03-24 MED ORDER — IBUPROFEN 800 MG PO TABS: 800.0000 mg | ORAL_TABLET | Freq: Three times a day (TID) | ORAL | Status: DC | PRN

## 2015-03-24 MED ORDER — ONDANSETRON 4 MG PO TBDP: 4.0000 mg | ORAL_TABLET | Freq: Three times a day (TID) | ORAL | Status: DC | PRN

## 2015-03-24 MED ORDER — CEPHALEXIN 500 MG PO CAPS: 500.0000 mg | ORAL_CAPSULE | Freq: Four times a day (QID) | ORAL | Status: DC

## 2015-03-24 NOTE — Discharge Instructions (Signed)

## 2015-03-24 NOTE — ED Notes (Signed)
Pt states she was dx x 1 week ago with a uti and she accidentally threw away the medication; pt states yesterday she started to feel worse

## 2015-03-24 NOTE — ED Provider Notes (Signed)
TIME SEEN: 5:00 AM  CHIEF COMPLAINT: Dysuria, lower abdominal pain, subjective fevers, nausea  HPI: Pt is a 25 y.o. female who recently had a normal spontaneous vaginal delivery on January 30 who presents emergency department with lower abdominal pain, discomfort in her back, dysuria and nausea. Has had subjective fevers and chills. No vomiting or diarrhea. States that her lochia is improving. No vaginal discharge. Was seen by Dr. Donzetta Matters with her OB/GYN was told she had a UTI 4 days ago and started on antibiotics. States that she accidentally threw these antibiotics away. States that she went several days over the weekend feeling well but then now her symptoms are back. She has had a bilateral tubal ligation. No other abdominal surgery.  ROS: See HPI Constitutional: Subjective fever  Eyes: no drainage  ENT: no runny nose   Cardiovascular:  no chest pain  Resp: no SOB  GI: no vomiting GU:  dysuria Integumentary: no rash  Allergy: no hives  Musculoskeletal: no leg swelling  Neurological: no slurred speech ROS otherwise negative  PAST MEDICAL HISTORY/PAST SURGICAL HISTORY:  Past Medical History  Diagnosis Date  . Anxiety   . Depression     MEDICATIONS:  Prior to Admission medications   Medication Sig Start Date End Date Taking? Authorizing Provider  cephALEXin (KEFLEX) 500 MG capsule Take 1 capsule (500 mg total) by mouth 4 (four) times daily. 12/19/13   Ivery Quale, PA-C  metroNIDAZOLE (FLAGYL) 500 MG tablet Take 1 tablet (500 mg total) by mouth 2 (two) times daily. 12/19/13   Ivery Quale, PA-C  phenazopyridine (PYRIDIUM) 100 MG tablet Take 1 tablet (100 mg total) by mouth 3 (three) times daily. Patient not taking: Reported on 12/19/2013 07/25/13   Ivery Quale, PA-C    ALLERGIES:  No Known Allergies  SOCIAL HISTORY:  Social History  Substance Use Topics  . Smoking status: Current Every Day Smoker -- 0.35 packs/day for 6 years    Types: Cigarettes  . Smokeless tobacco:  Never Used  . Alcohol Use: Yes    FAMILY HISTORY: Family History  Problem Relation Age of Onset  . Stroke Other   . Diabetes Other     EXAM: BP 132/102 mmHg  Pulse 92  Temp(Src) 99 F (37.2 C) (Oral)  Resp 18  Ht  (1.6 m)  Wt 138 lb (62.596 kg)  BMI 24.45 kg/m2  SpO2 97% CONSTITUTIONAL: Alert and oriented and responds appropriately to questions. Well-appearing; well-nourished HEAD: Normocephalic EYES: Conjunctivae clear, PERRL ENT: normal nose; no rhinorrhea; moist mucous membranes; pharynx without lesions noted NECK: Supple, no meningismus, no LAD  CARD: RRR; S1 and S2 appreciated; no murmurs, no clicks, no rubs, no gallops RESP: Normal chest excursion without splinting or tachypnea; breath sounds clear and equal bilaterally; no wheezes, no rhonchi, no rales, no hypoxia or respiratory distress, speaking full sentences ABD/GI: Normal bowel sounds; non-distended; soft, non-tender, no rebound, no guarding, no peritoneal signs Normal external genitalia. No lesions, rashes noted. Patient has mild dark red vaginal bleeding on exam. No vaginal discharge.  No adnexal tenderness or fullness, no cervical motion tenderness. Cervix is not appear friable.  No pain with manipulation or movement of the uterus. Patient has a slightly open cervix consistent with recent delivery, multi-gravidity.   BACK:  The back appears normal and is non-tender to palpation, there is no CVA tenderness EXT: Normal ROM in all joints; non-tender to palpation; no edema; normal capillary refill; no cyanosis, no calf tenderness or swelling    SKIN: Normal  color for age and race; warm NEURO: Moves all extremities equally, sensation to light touch intact diffusely, cranial nerves II through XII intact PSYCH: The patient's mood and manner are appropriate. Grooming and personal hygiene are appropriate.  MEDICAL DECISION MAKING: Patient here with lower abdominal pain, nausea and subjective fevers. Her abdominal exam  is completely benign. Her pelvic exam also reveals no sign of endometritis. She does have blood in her urine which may be from her vaginal bleeding but also leukocytes and bacteria. She is complaining of dysuria. I suspect that she does have urinary tract infection. Urine culture is pending. We have sent swabs for gonorrhea and chlamydia. She states that she has not put anything in her vagina since her delivery. She does have clue cells on her wet prep and has no foul odor, signs of vaginitis, discharge other than lochia. This time I do not feel this needs to be treated. Have advised her to follow-up with her OB/GYN. Discussed return precautions. Recommended alternating Tylenol and Motrin for fever and pain as patient is breast-feeding. Will discharge on Keflex. She verbalized understanding and is comfortable with this plan.       Layla Maw Ward, DO 03/24/15 1001

## 2015-03-25 LAB — URINE CULTURE: Culture: NO GROWTH

## 2015-03-25 LAB — GC/CHLAMYDIA PROBE AMP (~~LOC~~) NOT AT ARMC
CHLAMYDIA, DNA PROBE: NEGATIVE
NEISSERIA GONORRHEA: NEGATIVE

## 2017-10-13 ENCOUNTER — Emergency Department (HOSPITAL_COMMUNITY)
Admission: EM | Admit: 2017-10-13 | Discharge: 2017-10-13 | Disposition: A | Discharge status: Home / Self Care | Payer: Self-pay | Attending: Emergency Medicine | Admitting: Emergency Medicine

## 2017-10-13 ENCOUNTER — Other Ambulatory Visit: Payer: Self-pay

## 2017-10-13 ENCOUNTER — Encounter (HOSPITAL_COMMUNITY): Payer: Self-pay | Admitting: Emergency Medicine

## 2017-10-13 DIAGNOSIS — B9689 Other specified bacterial agents as the cause of diseases classified elsewhere: Secondary | ICD-10-CM | POA: Insufficient documentation

## 2017-10-13 DIAGNOSIS — N760 Acute vaginitis: Secondary | ICD-10-CM | POA: Insufficient documentation

## 2017-10-13 DIAGNOSIS — F17210 Nicotine dependence, cigarettes, uncomplicated: Secondary | ICD-10-CM | POA: Insufficient documentation

## 2017-10-13 LAB — PREGNANCY, URINE: Preg Test, Ur: NEGATIVE

## 2017-10-13 LAB — WET PREP, GENITAL
Sperm: NONE SEEN
Trich, Wet Prep: NONE SEEN
Yeast Wet Prep HPF POC: NONE SEEN

## 2017-10-13 LAB — URINALYSIS, ROUTINE W REFLEX MICROSCOPIC
Bacteria, UA: NONE SEEN
Bilirubin Urine: NEGATIVE
Glucose, UA: NEGATIVE mg/dL
Ketones, ur: NEGATIVE mg/dL
Leukocytes, UA: NEGATIVE
Nitrite: NEGATIVE
PROTEIN: NEGATIVE mg/dL
Specific Gravity, Urine: 1.004 — ABNORMAL LOW (ref 1.005–1.030)
pH: 7 (ref 5.0–8.0)

## 2017-10-13 MED ORDER — METRONIDAZOLE 500 MG PO TABS
500.0000 mg | ORAL_TABLET | Freq: Once | ORAL | Status: AC
  Administered 2017-10-13: 500 mg via ORAL
  Filled 2017-10-13: qty 1

## 2017-10-13 MED ORDER — METRONIDAZOLE 500 MG PO TABS: 500.0000 mg | ORAL_TABLET | Freq: Two times a day (BID) | ORAL | 0 refills | Status: DC

## 2017-10-13 NOTE — Discharge Instructions (Signed)
As discussed there are still labs that are pending including your gonorrhea, chlamydia, HIV and syphilis screening tests.  If any of these are positive you will be notified.  Complete the entire course of the antibiotic prescribed to treat your bacterial vaginosis.  Refer to the instructions below regarding this infection.

## 2017-10-13 NOTE — ED Provider Notes (Signed)
[facility] EMERGENCY DEPARTMENT Provider Note   CSN: [id number] Arrival date & time: 10/13/17  2041     History   Chief Complaint Chief Complaint  Patient presents with  . Vaginal Discharge    HPI Mercedes Reynolds is a 27 y.o. female who is currently sexually active with several partners, does not use condoms, presenting with a one month history of vaginal discharge and odor.  She denies abdominal or pelvic pain, no dysuria, hematuria, back or flank pain, no fevers, chills, n/v or other complaint.  Her periods have been regular, tubal ligation for birth control.  She has switched soaps and tried using a vagisil product without improvement in her symptoms.  She doubts exposure to std's at this time.  The history is provided by the patient.    Past Medical History:  Diagnosis Date  . Anxiety   . Depression     There are no active problems to display for this patient.   Past Surgical History:  Procedure Laterality Date  . TUBAL LIGATION       OB History    Gravida  2   Para  2   Term  2   Preterm      AB      Living  2     SAB      TAB      Ectopic      Multiple      Live Births               Home Medications    Prior to Admission medications   Medication Sig Start Date End Date Taking? Authorizing Provider  metroNIDAZOLE (FLAGYL) 500 MG tablet Take 1 tablet (500 mg total) by mouth 2 (two) times daily. 10/13/17   Burgess Amor, PA-C    Family History Family History  Problem Relation Age of Onset  . Stroke Other   . Diabetes Other     Social History Social History   Tobacco Use  . Smoking status: Current Every Day Smoker    Packs/day: 0.35    Years: 6.00    Pack years: 2.10    Types: Cigarettes  . Smokeless tobacco: Never Used  Substance Use Topics  . Alcohol use: Yes  . Drug use: Yes    Types: Marijuana     Allergies   Patient has no known allergies.   Review of Systems Review of Systems  Constitutional: Negative for  chills and fever.  HENT: Negative.   Eyes: Negative.   Respiratory: Negative for chest tightness and shortness of breath.   Cardiovascular: Negative for chest pain.  Gastrointestinal: Negative for abdominal pain and nausea.  Genitourinary: Positive for vaginal discharge. Negative for dysuria, flank pain, pelvic pain, urgency and vaginal pain.  Musculoskeletal: Negative for arthralgias, joint swelling and neck pain.  Skin: Negative.  Negative for rash and wound.  Neurological: Negative for dizziness, weakness, light-headedness, numbness and headaches.  Psychiatric/Behavioral: Negative.      Physical Exam Updated Vital Signs BP (!) 124/101   Pulse 72   Temp 97.9 F (36.6 C) (Oral)   Resp 16   Ht 5\' 5"  (1.651 m)   Wt 48.1 kg   LMP 09/18/2017   SpO2 100%   BMI 17.64 kg/m   Physical Exam  Constitutional: She appears well-developed and well-nourished.  HENT:  Head: Normocephalic and atraumatic.  Eyes: Conjunctivae are normal.  Cardiovascular: Normal rate and normal heart sounds.  Pulmonary/Chest: Effort normal.  Abdominal: Soft. Bowel sounds  are normal. She exhibits no mass. There is no tenderness. There is no guarding.  Genitourinary: Uterus normal. There is no rash, tenderness or lesion on the right labia. There is no rash, tenderness or lesion on the left labia. Uterus is not enlarged and not tender. Cervix exhibits no motion tenderness, no discharge and no friability. Right adnexum displays no mass, no tenderness and no fullness. Left adnexum displays no mass, no tenderness and no fullness. No erythema or tenderness in the vagina. Vaginal discharge found.  Musculoskeletal: Normal range of motion.  Neurological: She is alert.  Skin: Skin is warm and dry.  Psychiatric: She has a normal mood and affect.  Nursing note and vitals reviewed.    ED Treatments / Results  Labs (all labs ordered are listed, but only abnormal results are displayed) Labs Reviewed  WET PREP, GENITAL  - Abnormal; Notable for the following components:      Result Value   Clue Cells Wet Prep HPF POC PRESENT (*)    WBC, Wet Prep HPF POC MANY (*)    All other components within normal limits  URINALYSIS, ROUTINE W REFLEX MICROSCOPIC - Abnormal; Notable for the following components:   Color, Urine STRAW (*)    Specific Gravity, Urine 1.004 (*)    Hgb urine dipstick SMALL (*)    All other components within normal limits  PREGNANCY, URINE  HIV ANTIBODY (ROUTINE TESTING)  RPR  GC/CHLAMYDIA PROBE AMP () NOT AT [facility]    EKG None  Radiology No results found.  Procedures Procedures (including critical care time)  Medications Ordered in ED Medications  metroNIDAZOLE (FLAGYL) tablet 500 mg (500 mg Oral Given 10/13/17 2255)     Initial Impression / Assessment and Plan / ED Course  I have reviewed the triage vital signs and the nursing notes.  Pertinent labs & imaging results that were available during my care of the patient were reviewed by me and considered in my medical decision making (see chart for details).     Patient with a 1 month history of vaginal discharge and odor.  She does have bacterial vaginosis based on today's labs.  Although she has risk factors for STDs, she does not have any pelvic pain nor does she have any cervical motion tenderness on her exam.  She is aware that her GC chlamydia cultures are pending as are her HIV and syphilis screening, and she will be notified if either of these are positive.  We discussed safe sex practices and she was given information about this.  Final Clinical Impressions(s) / ED Diagnoses   Final diagnoses:  BV (bacterial vaginosis)    ED Discharge Orders         Ordered    metroNIDAZOLE (FLAGYL) 500 MG tablet  2 times daily     10/13/17 2236           Burgess Amor, PA-C 10/14/17 0139    Eber Hong, MD 10/17/17 [phone]

## 2017-10-13 NOTE — ED Triage Notes (Signed)
Pt c/o dark white vaginal d/c with foul odor for a month. New unprotected sexual partner.

## 2017-10-15 LAB — RPR: RPR: NONREACTIVE

## 2017-10-16 LAB — GC/CHLAMYDIA PROBE AMP (~~LOC~~) NOT AT ARMC
Chlamydia: NEGATIVE
Neisseria Gonorrhea: NEGATIVE

## 2017-10-16 LAB — HIV ANTIBODY (ROUTINE TESTING W REFLEX): HIV SCREEN 4TH GENERATION: NONREACTIVE

## 2017-11-09 ENCOUNTER — Other Ambulatory Visit: Payer: Self-pay

## 2017-11-09 ENCOUNTER — Emergency Department (HOSPITAL_COMMUNITY)
Admission: EM | Admit: 2017-11-09 | Discharge: 2017-11-09 | Disposition: A | Discharge status: Home / Self Care | Payer: Medicaid Other | Attending: Emergency Medicine | Admitting: Emergency Medicine

## 2017-11-09 ENCOUNTER — Encounter (HOSPITAL_COMMUNITY): Payer: Self-pay | Admitting: *Deleted

## 2017-11-09 DIAGNOSIS — F17210 Nicotine dependence, cigarettes, uncomplicated: Secondary | ICD-10-CM | POA: Insufficient documentation

## 2017-11-09 DIAGNOSIS — R319 Hematuria, unspecified: Secondary | ICD-10-CM | POA: Insufficient documentation

## 2017-11-09 DIAGNOSIS — Z79899 Other long term (current) drug therapy: Secondary | ICD-10-CM | POA: Insufficient documentation

## 2017-11-09 DIAGNOSIS — N390 Urinary tract infection, site not specified: Secondary | ICD-10-CM | POA: Insufficient documentation

## 2017-11-09 LAB — URINALYSIS, ROUTINE W REFLEX MICROSCOPIC
Bilirubin Urine: NEGATIVE
Glucose, UA: NEGATIVE mg/dL
KETONES UR: NEGATIVE mg/dL
NITRITE: NEGATIVE
Protein, ur: 100 mg/dL — AB
Specific Gravity, Urine: 1.009 (ref 1.005–1.030)
WBC, UA: >50 WBC/hpf — ABNORMAL HIGH (ref 0–5)
pH: 7 (ref 5.0–8.0)

## 2017-11-09 LAB — PREGNANCY, URINE: Preg Test, Ur: NEGATIVE

## 2017-11-09 MED ORDER — CEPHALEXIN 500 MG PO CAPS
500.0000 mg | ORAL_CAPSULE | Freq: Once | ORAL | Status: AC
  Administered 2017-11-09: 500 mg via ORAL
  Filled 2017-11-09: qty 1

## 2017-11-09 MED ORDER — PHENAZOPYRIDINE HCL 200 MG PO TABS: 200.0000 mg | ORAL_TABLET | Freq: Three times a day (TID) | ORAL | 0 refills | Status: DC

## 2017-11-09 MED ORDER — PHENAZOPYRIDINE HCL 100 MG PO TABS
200.0000 mg | ORAL_TABLET | Freq: Once | ORAL | Status: AC
  Administered 2017-11-09: 200 mg via ORAL
  Filled 2017-11-09: qty 2

## 2017-11-09 MED ORDER — CEPHALEXIN 500 MG PO CAPS: 500.0000 mg | ORAL_CAPSULE | Freq: Four times a day (QID) | ORAL | 0 refills | Status: DC

## 2017-11-09 NOTE — Discharge Instructions (Addendum)
Return if any problems.

## 2017-11-09 NOTE — ED Triage Notes (Signed)
Pt states she started urinating blood today; pt states she has felt pressure in her vaginal area;

## 2017-11-10 NOTE — ED Provider Notes (Signed)
[facility] EMERGENCY DEPARTMENT Provider Note   CSN: [id number] Arrival date & time: 11/09/17  4098     History   Chief Complaint Chief Complaint  Patient presents with  . Dysuria    HPI Mercedes Reynolds is a 27 y.o. female.  The history is provided by the patient. No language interpreter was used.  Dysuria   This is a new problem. The current episode started 12 to 24 hours ago. The problem occurs every urination. The problem has been gradually worsening. The quality of the pain is described as burning and aching. The pain is moderate. There has been no fever. Pertinent negatives include no chills. She has tried nothing for the symptoms. Her past medical history does not include recurrent UTIs.  Pt complains of burining with urination and blood in urine  Past Medical History:  Diagnosis Date  . Anxiety   . Depression     There are no active problems to display for this patient.   Past Surgical History:  Procedure Laterality Date  . TUBAL LIGATION       OB History    Gravida  2   Para  2   Term  2   Preterm      AB      Living  2     SAB      TAB      Ectopic      Multiple      Live Births               Home Medications    Prior to Admission medications   Medication Sig Start Date End Date Taking? Authorizing Provider  cephALEXin (KEFLEX) 500 MG capsule Take 1 capsule (500 mg total) by mouth 4 (four) times daily. 11/09/17   Elson Areas, PA-C  metroNIDAZOLE (FLAGYL) 500 MG tablet Take 1 tablet (500 mg total) by mouth 2 (two) times daily. 10/13/17   Burgess Amor, PA-C  phenazopyridine (PYRIDIUM) 200 MG tablet Take 1 tablet (200 mg total) by mouth 3 (three) times daily. 11/09/17   Elson Areas, PA-C    Family History Family History  Problem Relation Age of Onset  . Stroke Other   . Diabetes Other     Social History Social History   Tobacco Use  . Smoking status: Current Every Day Smoker    Packs/day: 0.35    Years: 6.00   Pack years: 2.10    Types: Cigarettes  . Smokeless tobacco: Never Used  Substance Use Topics  . Alcohol use: Yes  . Drug use: Yes    Types: Marijuana     Allergies   Patient has no known allergies.   Review of Systems Review of Systems  Constitutional: Negative for chills.  Genitourinary: Positive for dysuria.  All other systems reviewed and are negative.    Physical Exam Updated Vital Signs BP 130/90 (BP Location: Right Arm)   Pulse 68   Temp 98.1 F (36.7 C) (Oral)   Resp 16   Ht 5\' 5"  (1.651 m)   Wt 48.1 kg   LMP 10/22/2017   SpO2 100%   BMI 17.64 kg/m   Physical Exam  Constitutional: She appears well-developed and well-nourished.  HENT:  Head: Normocephalic.  Cardiovascular: Normal rate.  Pulmonary/Chest: Effort normal.  Abdominal: Soft.  Musculoskeletal: Normal range of motion.  Neurological: She is alert.  Skin: Skin is warm.  Psychiatric: She has a normal mood and affect.  Nursing note and vitals reviewed.  ED Treatments / Results  Labs (all labs ordered are listed, but only abnormal results are displayed) Labs Reviewed  URINALYSIS, ROUTINE W REFLEX MICROSCOPIC - Abnormal; Notable for the following components:      Result Value   Color, Urine RED (*)    APPearance CLOUDY (*)    Hgb urine dipstick LARGE (*)    Protein, ur 100 (*)    Leukocytes, UA LARGE (*)    RBC / HPF >50 (*)    WBC, UA >50 (*)    Bacteria, UA RARE (*)    All other components within normal limits  URINE CULTURE  PREGNANCY, URINE    EKG None  Radiology No results found.  Procedures Procedures (including critical care time)  Medications Ordered in ED Medications  cephALEXin (KEFLEX) capsule 500 mg (500 mg Oral Given 11/09/17 2027)  phenazopyridine (PYRIDIUM) tablet 200 mg (200 mg Oral Given 11/09/17 2027)     Initial Impression / Assessment and Plan / ED Course  I have reviewed the triage vital signs and the nursing notes.  Pertinent labs & imaging results  that were available during my care of the patient were reviewed by me and considered in my medical decision making (see chart for details).     MDM  Urine shows greater than 50 wbc's and greater than 50 rbc's   Final Clinical Impressions(s) / ED Diagnoses   Final diagnoses:  Urinary tract infection with hematuria, site unspecified    ED Discharge Orders         Ordered    cephALEXin (KEFLEX) 500 MG capsule  4 times daily     11/09/17 2017    phenazopyridine (PYRIDIUM) 200 MG tablet  3 times daily     11/09/17 2017        An After Visit Summary was printed and given to the patient.    Elson Areas, PA-C 11/10/17 0015    Benjiman Core, MD 11/13/17 1455

## 2017-11-12 LAB — URINE CULTURE: SPECIAL REQUESTS: NORMAL

## 2018-03-06 ENCOUNTER — Emergency Department (HOSPITAL_COMMUNITY)
Admission: EM | Admit: 2018-03-06 | Discharge: 2018-03-07 | Disposition: A | Discharge status: Home / Self Care | Payer: Self-pay | Attending: Emergency Medicine | Admitting: Emergency Medicine

## 2018-03-06 ENCOUNTER — Encounter (HOSPITAL_COMMUNITY): Payer: Self-pay | Admitting: Emergency Medicine

## 2018-03-06 ENCOUNTER — Other Ambulatory Visit: Payer: Self-pay

## 2018-03-06 ENCOUNTER — Emergency Department (HOSPITAL_COMMUNITY): Payer: Self-pay

## 2018-03-06 DIAGNOSIS — N3000 Acute cystitis without hematuria: Secondary | ICD-10-CM | POA: Insufficient documentation

## 2018-03-06 DIAGNOSIS — R0602 Shortness of breath: Secondary | ICD-10-CM | POA: Insufficient documentation

## 2018-03-06 DIAGNOSIS — R509 Fever, unspecified: Secondary | ICD-10-CM | POA: Insufficient documentation

## 2018-03-06 DIAGNOSIS — F17210 Nicotine dependence, cigarettes, uncomplicated: Secondary | ICD-10-CM | POA: Insufficient documentation

## 2018-03-06 LAB — COMPREHENSIVE METABOLIC PANEL
ALT: 13 U/L (ref 0–44)
AST: 19 U/L (ref 15–41)
Albumin: 4.2 g/dL (ref 3.5–5.0)
Alkaline Phosphatase: 46 U/L (ref 38–126)
Anion gap: 7 (ref 5–15)
BUN: 10 mg/dL (ref 6–20)
CO2: 22 mmol/L (ref 22–32)
Calcium: 8.7 mg/dL — ABNORMAL LOW (ref 8.9–10.3)
Chloride: 103 mmol/L (ref 98–111)
Creatinine, Ser: 0.67 mg/dL (ref 0.44–1.00)
GFR calc Af Amer: >60 mL/min (ref >60)
GFR calc non Af Amer: >60 mL/min (ref >60)
Glucose, Bld: 104 mg/dL — ABNORMAL HIGH (ref 70–99)
POTASSIUM: 3.3 mmol/L — AB (ref 3.5–5.1)
Sodium: 132 mmol/L — ABNORMAL LOW (ref 135–145)
Total Bilirubin: 0.5 mg/dL (ref 0.3–1.2)
Total Protein: 7.4 g/dL (ref 6.5–8.1)

## 2018-03-06 LAB — URINALYSIS, ROUTINE W REFLEX MICROSCOPIC
Bilirubin Urine: NEGATIVE
Glucose, UA: NEGATIVE mg/dL
Hgb urine dipstick: NEGATIVE
Ketones, ur: NEGATIVE mg/dL
Nitrite: NEGATIVE
Protein, ur: NEGATIVE mg/dL
Specific Gravity, Urine: 1.026 (ref 1.005–1.030)
pH: 7 (ref 5.0–8.0)

## 2018-03-06 LAB — CBC WITH DIFFERENTIAL/PLATELET
Abs Immature Granulocytes: 0.01 10*3/uL (ref 0.00–0.07)
Basophils Absolute: 0 10*3/uL (ref 0.0–0.1)
Basophils Relative: 0 %
EOS ABS: 0.3 10*3/uL (ref 0.0–0.5)
Eosinophils Relative: 6 %
HCT: 36.2 % (ref 36.0–46.0)
Hemoglobin: 11.5 g/dL — ABNORMAL LOW (ref 12.0–15.0)
Immature Granulocytes: 0 %
Lymphocytes Relative: 10 %
Lymphs Abs: 0.6 10*3/uL — ABNORMAL LOW (ref 0.7–4.0)
MCH: 26.9 pg (ref 26.0–34.0)
MCHC: 31.8 g/dL (ref 30.0–36.0)
MCV: 84.6 fL (ref 80.0–100.0)
Monocytes Absolute: 0.9 10*3/uL (ref 0.1–1.0)
Monocytes Relative: 15 %
Neutro Abs: 3.9 10*3/uL (ref 1.7–7.7)
Neutrophils Relative %: 69 %
Platelets: 254 10*3/uL (ref 150–400)
RBC: 4.28 MIL/uL (ref 3.87–5.11)
RDW: 13.4 % (ref 11.5–15.5)
WBC: 5.7 10*3/uL (ref 4.0–10.5)
nRBC: 0 % (ref 0.0–0.2)

## 2018-03-06 LAB — I-STAT BETA HCG BLOOD, ED (MC, WL, AP ONLY): I-stat hCG, quantitative: <5 m[IU]/mL (ref <5)

## 2018-03-06 MED ORDER — KETOROLAC TROMETHAMINE 30 MG/ML IJ SOLN
30.0000 mg | Freq: Once | INTRAMUSCULAR | Status: AC
  Administered 2018-03-06: 30 mg via INTRAVENOUS
  Filled 2018-03-06: qty 1

## 2018-03-06 MED ORDER — SODIUM CHLORIDE 0.9 % IV BOLUS
1000.0000 mL | Freq: Once | INTRAVENOUS | Status: AC
  Administered 2018-03-06: 1000 mL via INTRAVENOUS

## 2018-03-06 MED ORDER — ONDANSETRON HCL 4 MG/2ML IJ SOLN
4.0000 mg | Freq: Once | INTRAMUSCULAR | Status: AC
  Administered 2018-03-06: 4 mg via INTRAVENOUS
  Filled 2018-03-06: qty 2

## 2018-03-06 MED ORDER — NAPROXEN 500 MG PO TABS: ORAL_TABLET | ORAL | 0 refills | Status: DC

## 2018-03-06 NOTE — ED Provider Notes (Addendum)
[facility] EMERGENCY DEPARTMENT Provider Note   CSN: [id number] Arrival date & time: 03/06/18  2121     History   Chief Complaint Chief Complaint  Patient presents with  . Headache    HPI Mercedes Reynolds is a 28 y.o. female.  Patient complains of fever chills aches cough some dysuria  The history is provided by the patient.  Headache  Pain location:  Generalized Quality:  Dull Radiates to:  Does not radiate Severity currently:  3/10 Severity at highest:  4/10 Onset quality:  Gradual Timing:  Constant Progression:  Waxing and waning Chronicity:  New Similar to prior headaches: no   Context: not activity   Associated symptoms: cough   Associated symptoms: no abdominal pain, no back pain, no congestion, no diarrhea, no fatigue, no seizures and no sinus pressure     Past Medical History:  Diagnosis Date  . Anxiety   . Depression     There are no active problems to display for this patient.   Past Surgical History:  Procedure Laterality Date  . TUBAL LIGATION       OB History    Gravida  2   Para  2   Term  2   Preterm      AB      Living  2     SAB      TAB      Ectopic      Multiple      Live Births               Home Medications    Prior to Admission medications   Not on File    Family History Family History  Problem Relation Age of Onset  . Stroke Other   . Diabetes Other     Social History Social History   Tobacco Use  . Smoking status: Current Every Day Smoker    Packs/day: 0.35    Years: 6.00    Pack years: 2.10    Types: Cigarettes  . Smokeless tobacco: Never Used  Substance Use Topics  . Alcohol use: Yes  . Drug use: Yes    Types: Marijuana     Allergies   Patient has no known allergies.   Review of Systems Review of Systems  Constitutional: Negative for appetite change and fatigue.  HENT: Negative for congestion, ear discharge and sinus pressure.   Eyes: Negative for discharge.    Respiratory: Positive for cough.   Cardiovascular: Negative for chest pain.  Gastrointestinal: Negative for abdominal pain and diarrhea.  Genitourinary: Negative for frequency and hematuria.  Musculoskeletal: Negative for back pain.  Skin: Negative for rash.  Neurological: Positive for headaches. Negative for seizures.  Psychiatric/Behavioral: Negative for hallucinations.     Physical Exam Updated Vital Signs BP (!) 168/96 (BP Location: Right Arm)   Pulse (!) 106   Temp 99.3 F (37.4 C) (Oral)   Resp 16   Ht 5\' 5"  (1.651 m)   Wt 49.9 kg   LMP 02/10/2018   SpO2 97%   BMI 18.30 kg/m   Physical Exam Vitals signs and nursing note reviewed.  Constitutional:      Appearance: Normal appearance. She is well-developed.  HENT:     Head: Normocephalic.     Nose: Nose normal.  Eyes:     General: No scleral icterus.    Conjunctiva/sclera: Conjunctivae normal.  Neck:     Musculoskeletal: Neck supple.     Thyroid: No thyromegaly.  Cardiovascular:  Rate and Rhythm: Normal rate and regular rhythm.     Heart sounds: No murmur. No friction rub. No gallop.   Pulmonary:     Breath sounds: No stridor. No wheezing or rales.  Chest:     Chest wall: No tenderness.  Abdominal:     General: There is no distension.     Tenderness: There is no abdominal tenderness. There is no rebound.  Musculoskeletal: Normal range of motion.  Lymphadenopathy:     Cervical: No cervical adenopathy.  Skin:    Findings: No erythema or rash.  Neurological:     Mental Status: She is alert and oriented to person, place, and time.     Motor: No abnormal muscle tone.     Coordination: Coordination normal.  Psychiatric:        Behavior: Behavior normal.      ED Treatments / Results  Labs (all labs ordered are listed, but only abnormal results are displayed) Labs Reviewed  CBC WITH DIFFERENTIAL/PLATELET - Abnormal; Notable for the following components:      Result Value   Hemoglobin 11.5 (*)     Lymphs Abs 0.6 (*)    All other components within normal limits  COMPREHENSIVE METABOLIC PANEL - Abnormal; Notable for the following components:   Sodium 132 (*)    Potassium 3.3 (*)    Glucose, Bld 104 (*)    Calcium 8.7 (*)    All other components within normal limits  URINALYSIS, ROUTINE W REFLEX MICROSCOPIC  I-STAT BETA HCG BLOOD, ED ([facility], [facility], [facility] ONLY)    EKG None  Radiology Dg Chest 2 View  Result Date: 03/06/2018 CLINICAL DATA:  Cough, mid chest pain.  Shortness of breath. EXAM: CHEST - 2 VIEW COMPARISON:  None. FINDINGS: Cardiomediastinal silhouette is normal. No pleural effusions or focal consolidations. Trachea projects midline and there is no pneumothorax. Soft tissue planes and included osseous structures are non-suspicious. LEFT nipple piercing. Mild thoracic dextroscoliosis. IMPRESSION: Negative. Electronically Signed   By: Awilda Metro M.D.   On: 03/06/2018 22:45    Procedures Procedures (including critical care time)  Medications Ordered in ED Medications  sodium chloride 0.9 % bolus 1,000 mL (0 mLs Intravenous Stopped 03/06/18 2317)  ondansetron (ZOFRAN) injection 4 mg (4 mg Intravenous Given 03/06/18 2213)  ketorolac (TORADOL) 30 MG/ML injection 30 mg (30 mg Intravenous Given 03/06/18 2213)     Initial Impression / Assessment and Plan / ED Course  I have reviewed the triage vital signs and the nursing notes.  Pertinent labs & imaging results that were available during my care of the patient were reviewed by me and considered in my medical decision making (see chart for details).     Blood test and x-rays unremarkable.  Urinalysis suggest urinary tract infection.  Patient will be placed on Keflex and also given Naprosyn Final Clinical Impressions(s) / ED Diagnoses   Final diagnoses:  None    ED Discharge Orders    None       Bethann Berkshire, MD 03/06/18 [name]    Bethann Berkshire, MD 03/07/18 0002

## 2018-03-06 NOTE — ED Triage Notes (Signed)
Pt C/O headache, chest pain, and generalized body aches that began 4 days ago. Pt also stating that she cannot get warm and she feels dehydrated. Pt also C/O neck pain.

## 2018-03-06 NOTE — Discharge Instructions (Addendum)
Drink plenty of fluids take Tylenol for any fever and follow-up next week for recheck

## 2018-03-07 MED ORDER — CEPHALEXIN 500 MG PO CAPS: 500.0000 mg | ORAL_CAPSULE | Freq: Three times a day (TID) | ORAL | 0 refills | Status: DC

## 2018-03-07 MED ORDER — CEPHALEXIN 500 MG PO CAPS
500.0000 mg | ORAL_CAPSULE | Freq: Once | ORAL | Status: AC
  Administered 2018-03-07: 500 mg via ORAL
  Filled 2018-03-07: qty 1

## 2018-03-09 ENCOUNTER — Telehealth: Payer: Self-pay | Admitting: Family Medicine

## 2018-03-09 LAB — URINE CULTURE

## 2018-03-09 NOTE — Telephone Encounter (Signed)
New pt appt approved.  I tired to call the patient and her mail box is full.

## 2018-03-10 ENCOUNTER — Telehealth: Payer: Self-pay | Admitting: Emergency Medicine

## 2018-03-10 NOTE — Telephone Encounter (Signed)
Post ED Visit - Positive Culture Follow-up  Culture report reviewed by antimicrobial stewardship pharmacist:  []  Enzo Bi, Pharm.D. []  Celedonio Miyamoto, Pharm.D., BCPS AQ-ID [x]  Garvin Fila, Pharm.D., BCPS []  Georgina Pillion, Pharm.D., BCPS []  Parker, [address].D., BCPS, AAHIVP []  Estella Husk, Pharm.D., BCPS, AAHIVP []  Lysle Pearl, PharmD, BCPS []  Phillips Climes, PharmD, BCPS []  Agapito Games, PharmD, BCPS []  Verlan Friends, PharmD  Positive urine culture Treated with cephalexin, organism sensitive to the same and no further patient follow-up is required at this time.  Norm Parcel RN 03/10/2018, 9:39 AM

## 2020-04-02 ENCOUNTER — Emergency Department (HOSPITAL_COMMUNITY)
Admission: EM | Admit: 2020-04-02 | Discharge: 2020-04-02 | Disposition: A | Discharge status: Home / Self Care | Payer: Medicaid Other | Attending: Emergency Medicine | Admitting: Emergency Medicine

## 2020-04-02 ENCOUNTER — Encounter (HOSPITAL_COMMUNITY): Payer: Self-pay | Admitting: Emergency Medicine

## 2020-04-02 ENCOUNTER — Emergency Department (HOSPITAL_COMMUNITY): Payer: Medicaid Other

## 2020-04-02 ENCOUNTER — Other Ambulatory Visit: Payer: Self-pay

## 2020-04-02 DIAGNOSIS — Z23 Encounter for immunization: Secondary | ICD-10-CM | POA: Diagnosis not present

## 2020-04-02 DIAGNOSIS — M25522 Pain in left elbow: Secondary | ICD-10-CM | POA: Diagnosis present

## 2020-04-02 DIAGNOSIS — L03114 Cellulitis of left upper limb: Secondary | ICD-10-CM | POA: Insufficient documentation

## 2020-04-02 DIAGNOSIS — F17210 Nicotine dependence, cigarettes, uncomplicated: Secondary | ICD-10-CM | POA: Diagnosis not present

## 2020-04-02 HISTORY — DX: Post-traumatic stress disorder, unspecified: F43.10

## 2020-04-02 MED ORDER — TETANUS-DIPHTH-ACELL PERTUSSIS 5-2.5-18.5 LF-MCG/0.5 IM SUSY
0.5000 mL | PREFILLED_SYRINGE | Freq: Once | INTRAMUSCULAR | Status: AC
  Administered 2020-04-02: 0.5 mL via INTRAMUSCULAR
  Filled 2020-04-02: qty 0.5

## 2020-04-02 MED ORDER — DOXYCYCLINE HYCLATE 100 MG PO CAPS: 100.0000 mg | ORAL_CAPSULE | Freq: Two times a day (BID) | ORAL | 0 refills | Status: DC

## 2020-04-02 MED ORDER — DOXYCYCLINE HYCLATE 100 MG PO TABS
100.0000 mg | ORAL_TABLET | Freq: Once | ORAL | Status: AC
  Administered 2020-04-02: 100 mg via ORAL
  Filled 2020-04-02: qty 1

## 2020-04-02 NOTE — Discharge Instructions (Addendum)
  Antiinflammatory medications: Take 600 mg of ibuprofen every 6 hours or 440 mg (over the counter dose) to 500 mg (prescription dose) of naproxen every 12 hours for the next 3 days. After this time, these medications may be used as needed for pain. Take these medications with food to avoid upset stomach. Choose only one of these medications, do not take them together. Acetaminophen (generic for Tylenol): Should you continue to have additional pain while taking the ibuprofen or naproxen, you may add in acetaminophen as needed. Your daily total maximum amount of acetaminophen from all sources should be limited to 4000mg /day for persons without liver problems, or 2000mg /day for those with liver problems.  Please take all of your antibiotics until finished!   You may develop abdominal discomfort or diarrhea from the antibiotic.  You may help offset this with probiotics which you can buy or get in yogurt. Do not eat or take the probiotics until 2 hours after your antibiotic.   Follow-up: Follow-up with a primary care provider or orthopedic specialist for any further management of this issue.  Return: Return to the emergency department for continued spreading redness, onset of fever, worsening swelling, worsening pain, stiffness in the joint; despite antibiotics.

## 2020-04-02 NOTE — ED Provider Notes (Signed)
[facility] EMERGENCY DEPARTMENT Provider Note   CSN: [id number] Arrival date & time: 04/02/20  1832     History Chief Complaint  Patient presents with  . Elbow Pain    Mercedes Reynolds is a 30 y.o. female.  HPI      Mercedes Reynolds is a 30 y.o. female, with a history of anxiety and depression, presenting to the ED with left elbow pain beginning 2 days ago. She states it started from a small area on the skin that looked like a pimple and noticed redness spreading from that location. She did have a new tattoo done in this location about a week ago. She does think she had a previous staph infection at one point. Unknown last tetanus vaccination. Denies fever, lymphadenopathy, swelling within the joint, anterior elbow pain, numbness, weakness, or any other complaints.    Past Medical History:  Diagnosis Date  . Anxiety   . Depression   . PTSD (post-traumatic stress disorder)     There are no problems to display for this patient.   Past Surgical History:  Procedure Laterality Date  . TUBAL LIGATION       OB History    Gravida  2   Para  2   Term  2   Preterm      AB      Living  2     SAB      IAB      Ectopic      Multiple      Live Births              Family History  Problem Relation Age of Onset  . Stroke Other   . Diabetes Other     Social History   Tobacco Use  . Smoking status: Current Every Day Smoker    Packs/day: 0.50    Years: 6.00    Pack years: 3.00    Types: Cigarettes  . Smokeless tobacco: Never Used  Vaping Use  . Vaping Use: Never used  Substance Use Topics  . Alcohol use: Yes    Alcohol/week: 84.0 standard drinks    Types: 84 Cans of beer per week  . Drug use: Yes    Types: Cocaine    Comment: denies MJ (snorts Cocaine)    Home Medications Prior to Admission medications   Medication Sig Start Date End Date Taking? Authorizing Provider  doxycycline (VIBRAMYCIN) 100 MG capsule Take 1 capsule (100 mg  total) by mouth 2 (two) times daily. 04/02/20  Yes Joy, Shawn C, PA-C    Allergies    Patient has no known allergies.  Review of Systems   Review of Systems  Constitutional: Negative for fever.  Musculoskeletal: Positive for arthralgias.  Skin: Positive for color change.  Neurological: Negative for weakness and numbness.    Physical Exam Updated Vital Signs BP (!) 140/96 (BP Location: Right Arm)   Pulse 90   Temp 98.7 F (37.1 C) (Oral)   Resp 16   Ht 5\' 5"  (1.651 m)   Wt 48.1 kg   LMP 03/09/2020 (Approximate)   SpO2 100%   BMI 17.64 kg/m   Physical Exam Vitals and nursing note reviewed.  Constitutional:      General: She is not in acute distress.    Appearance: She is well-developed and well-nourished. She is not diaphoretic.  HENT:     Head: Normocephalic and atraumatic.  Eyes:     Conjunctiva/sclera: Conjunctivae normal.  Cardiovascular:  Rate and Rhythm: Normal rate and regular rhythm.     Pulses:          Radial pulses are 2+ on the left side.  Pulmonary:     Effort: Pulmonary effort is normal.  Musculoskeletal:     Cervical back: Neck supple.     Comments: Erythema and some swelling noted uniformly across the posterior elbow without a discrete area of significant swelling or fluctuance. Patient allows almost complete range of motion in the left elbow. No tenderness, effusion, or swelling the medial, lateral, or anterior elbow.  Skin:    General: Skin is warm and dry.     Capillary Refill: Capillary refill takes less than 2 seconds.     Coloration: Skin is not pale.  Neurological:     Mental Status: She is alert.     Comments: Sensation light touch grossly intact in the left upper extremity. Grip strength equal. Strength 5/5 in the bilateral triceps/biceps.  Psychiatric:        Mood and Affect: Mood and affect normal.        Behavior: Behavior normal.                     ED Results / Procedures / Treatments   Labs (all labs  ordered are listed, but only abnormal results are displayed) Labs Reviewed - No data to display  EKG None  Radiology DG Elbow Complete Left  Result Date: 04/02/2020 CLINICAL DATA:  30 year old female with left elbow pain and swelling. EXAM: LEFT ELBOW - COMPLETE 3+ VIEW COMPARISON:  Left elbow radiograph dated 06/25/2006. FINDINGS: There is no acute fracture or dislocation. Evaluation however is limited as a true 90 degree lateral view is not provided. No significant joint effusion. Mild soft tissue thickening and subcutaneous stranding of the dorsum of the elbow may represent cellulitis or olecranon bursitis. No radiopaque foreign object or soft tissue gas. IMPRESSION: 1. No acute fracture or dislocation. 2. Findings may represent cellulitis of the dorsal elbow or olecranon bursitis. Electronically Signed   By: Elgie Collard M.D.   On: 04/02/2020 19:38    Procedures Procedures   Medications Ordered in ED Medications  doxycycline (VIBRA-TABS) tablet 100 mg (has no administration in time range)  Tdap (BOOSTRIX) injection 0.5 mL (0.5 mLs Intramuscular Given 04/02/20 1950)    ED Course  I have reviewed the triage vital signs and the nursing notes.  Pertinent labs & imaging results that were available during my care of the patient were reviewed by me and considered in my medical decision making (see chart for details).    MDM Rules/Calculators/A&P                          Patient presents with left elbow pain and erythema. Patient is nontoxic appearing, afebrile, not tachycardic, not tachypneic, not hypotensive, maintains excellent SPO2 on room air, and is in no apparent distress.  Patient's current presentation and physical exam favors cellulitis rather than septic joint or even septic bursitis. We will initiate antibiotics and give patient option for orthopedic follow-up. The patient was given instructions for home care as well as return precautions. Patient voices understanding of  these instructions, accepts the plan, and is comfortable with discharge.  I reviewed and interpreted the patient's radiological studies.  Findings and plan of care discussed with attending physician, Eber Hong, MD. Dr. Hyacinth Meeker personally evaluated and examined this patient.  Final Clinical Impression(s) / ED Diagnoses  Final diagnoses:  Cellulitis of left upper extremity    Rx / DC Orders ED Discharge Orders         Ordered    doxycycline (VIBRAMYCIN) 100 MG capsule  2 times daily        04/02/20 1944           Concepcion Living 04/02/20 1958    Eber Hong, MD 04/04/20 1455

## 2020-04-02 NOTE — ED Provider Notes (Signed)
Medical screening examination/treatment/procedure(s) were conducted as a shared visit with non-physician practitioner(s) and myself.  I personally evaluated the patient during the encounter.  Clinical Impression:   Final diagnoses:  Cellulitis of left upper extremity   This patient is an otherwise healthy 30 year old female, she presents approximately 1 week after getting a new tattoo on her left proximal forearm just distal to the olecranon, she noticed several days ago that there was a small pustule that came up in that area, she then noticed some spreading redness and that area as well.  She has no fevers or chills and is able to fully range of motion her elbow with minimal discomfort.  There is some tenderness directly over the olecranon but no full bursa, no areas of induration or fluctuance, there does appear to be some warmth to this red skin which is likely an early cellulitis.  The patient has normal vital signs with regards to temperature heart rate respirations and oxygenation.  Blood pressure was slightly elevated at 140/96.  The patient will be treated with doxycycline, twice a day for 10 days, anti-inflammatories and close follow-up.  At this time there is no indication for incision and drainage and this is not septic arthritis clinically.  Pt is agreeable to my recommendations and is able to voice her understanding to the indications for return   Eber Hong, MD 04/04/20 1455

## 2020-04-02 NOTE — ED Triage Notes (Signed)
Pt c/o Left elbow swelling that began on Monday night.  Patient has several new tattoos.   Left elbow is red, warm to the touch, and swollen.

## 2020-07-15 ENCOUNTER — Ambulatory Visit (INDEPENDENT_AMBULATORY_CARE_PROVIDER_SITE_OTHER): Payer: Medicaid Other | Admitting: Women's Health

## 2020-07-15 ENCOUNTER — Other Ambulatory Visit: Payer: Self-pay

## 2020-07-15 ENCOUNTER — Encounter: Payer: Self-pay | Admitting: Women's Health

## 2020-07-15 ENCOUNTER — Other Ambulatory Visit (HOSPITAL_COMMUNITY)
Admission: RE | Admit: 2020-07-15 | Discharge: 2020-07-15 | Disposition: A | Discharge status: Home / Self Care | Payer: Medicaid Other | Source: Ambulatory Visit | Attending: Obstetrics & Gynecology | Admitting: Obstetrics & Gynecology

## 2020-07-15 VITALS — BP 100/68 | HR 72 | Ht 65.0 in | Wt 120.0 lb

## 2020-07-15 DIAGNOSIS — Z113 Encounter for screening for infections with a predominantly sexual mode of transmission: Secondary | ICD-10-CM | POA: Diagnosis not present

## 2020-07-15 DIAGNOSIS — K5900 Constipation, unspecified: Secondary | ICD-10-CM

## 2020-07-15 DIAGNOSIS — Z01419 Encounter for gynecological examination (general) (routine) without abnormal findings: Secondary | ICD-10-CM | POA: Insufficient documentation

## 2020-07-15 DIAGNOSIS — F418 Other specified anxiety disorders: Secondary | ICD-10-CM | POA: Diagnosis not present

## 2020-07-15 DIAGNOSIS — Z8742 Personal history of other diseases of the female genital tract: Secondary | ICD-10-CM

## 2020-07-15 NOTE — Progress Notes (Signed)
WELL-WOMAN EXAMINATION Patient name: Mercedes Reynolds MRN [id number]  Date of birth: 1990-06-26 Chief Complaint:   Gynecologic Exam (Pap/physcial)  History of Present Illness:   Mercedes Reynolds is a 30 y.o. [phone] African-American female being seen today for a routine well-woman exam.  Current complaints: had BTL, then got pregnant after, partner didn't want her to have it so she had EAB, regrets it b/c she does want another baby. Is interested in tubal reversal. 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 Discussed working on getting herself completely clean before trying for tubal reversal/pregnancy. Dep/anx, on celexa but stops taking when she relapses. Goes to Dr. Geanie Cooley at [facility] and has group [facility] q Mon w/ Christine/therapist. Some occ thoughts of harming self, no plan. Has been unable to discuss this w/ Wynona Canes b/c she didn't want to bring it up in group setting.  Recently went to [facility] ED dx w/ UTI and BV, was told she had cysts during speculum exam, so is concerned. Has occ sharp lower abdominal pains. BMs maybe once/wk.  PCP: none      does not desire labs Patient's last menstrual period was 07/05/2020. The current method of family planning is tubal ligation.  Last pap 05/22/18. Results were: ASCUS w/ HRHPV positive: type not specified. H/O abnormal pap: yes Last mammogram: never. Results were: N/A. Family h/o breast cancer: no Last colonoscopy: never. Results were: N/A. Family h/o colorectal cancer: yes MGF  Depression screen [facility] 2/9 07/15/2020  Decreased Interest 1  Down, Depressed, Hopeless 3  PHQ - 2 Score 4  Altered sleeping 2  Tired, decreased energy 3  Change in appetite 3  Feeling bad or failure about yourself  3  Trouble concentrating 2  Moving slowly or fidgety/restless 0  Suicidal thoughts 1  PHQ-9 Score 18     GAD 7 : Generalized Anxiety Score 07/15/2020  Nervous, Anxious, on Edge 2  Control/stop worrying 3  Worry too much - different things 3   Trouble relaxing 3  Restless 2  Easily annoyed or irritable 3  Afraid - awful might happen 3  Total GAD 7 Score 19     Review of Systems:   Pertinent items are noted in HPI Denies any headaches, blurred vision, fatigue, shortness of breath, chest pain, abdominal pain, abnormal vaginal discharge/itching/odor/irritation, problems with periods, bowel movements, urination, or intercourse unless otherwise stated above. Pertinent History Reviewed:  Reviewed past medical,surgical, social and family history.  Reviewed problem list, medications and allergies. Physical Assessment:   Vitals:   07/15/20 1403  BP: 100/68  Pulse: 72  Weight: 120 lb (54.4 kg)  Height: 5\' 5"  (1.651 m)  Body mass index is 19.97 kg/m.        Physical Examination:   General appearance - well appearing, and in no distress  Mental status - alert, oriented to person, place, and time  Psych:  She has a normal mood and affect  Skin - warm and dry, normal color, no suspicious lesions noted  Chest - effort normal, all lung fields clear to auscultation bilaterally  Heart - normal rate and regular rhythm  Neck:  midline trachea, no thyromegaly or nodules  Breasts - breasts appear normal, no suspicious masses, no skin or nipple changes or  axillary nodes  Abdomen - soft, nontender, nondistended, no masses or organomegaly  Pelvic - VULVA: normal appearing vulva with no masses, tenderness or lesions  VAGINA: normal appearing vagina with normal color and discharge, no lesions  CERVIX:  normal appearing cervix without discharge or lesions, no CMT, Nabothian cyst (this is likely what they saw at ED- discussed this is normal)  Thin prep pap is done w/ HR HPV cotesting  UTERUS: uterus is felt to be normal size, shape, consistency and nontender   ADNEXA: No adnexal masses or tenderness noted.  Extremities:  No swelling or varicosities noted  Chaperone: Latisha Cresenzo    No results found for this or any previous visit (from  the past 24 hour(s)).  Assessment & Plan:  1) Well-Woman Exam  2) Wants tubal reversal> gave info to fertility specialists  3) Cocaine use> strongly advised getting clean before checking on tubal reversal  4) Dep/anx w/ occ thoughts of self-harm> no plan, on celexa (stops taking during relapses), has weekly group therapy sessions through [facility]. Therapist unaware of these thoughts. Pt to make private appt w/ her or Dr. Geanie Cooley to discuss. 24hr crisis line given  5) STD screen> gc/ct on pap  6) Constipation> gave printed prevention/relief measures   Labs/procedures today: pap  Mammogram: @ 32yo, or sooner if problems Colonoscopy: @ 32yo, or sooner if problems  No orders of the defined types were placed in this encounter.   Meds: No orders of the defined types were placed in this encounter.   Follow-up: Return in about 1 year (around 07/15/2021) for Physical.  Cheral Marker CNM, [facility] 07/15/2020 2:57 PM

## 2020-07-15 NOTE — Patient Instructions (Addendum)
[facility]  . [employer], [address], [phone], [email or web address] . [facility]- [facility] [place], [address], [place] [address], [phone]    Constipation  Drink plenty of fluid, preferably water, throughout the day  Eat foods high in fiber such as fruits, vegetables, and grains  Exercise, such as walking, is a good way to keep your bowels regular  Drink warm fluids, especially warm prune juice, or decaf coffee  Eat a 1/2 cup of real oatmeal (not instant), 1/2 cup applesauce, and 1/2-1 cup warm prune juice every day  If needed, you may take Colace (docusate sodium) stool softener once or twice a day to help keep the stool soft. If you are pregnant, wait until you are out of your first trimester (12-14 weeks of pregnancy)  If you still are having problems with constipation, you may take Miralax once daily as needed to help keep your bowels regular.  If you are pregnant, wait until you are out of your first trimester (12-14 weeks of pregnancy)     If you're in crisis please use our 24-Hour Crisis Hotline for immediate access to a clinician. 24 Hour Crisis Line: [phone]

## 2020-07-17 LAB — CYTOLOGY - PAP
Chlamydia: NEGATIVE
Comment: NEGATIVE
Comment: NEGATIVE
Comment: NORMAL
Diagnosis: NEGATIVE
High risk HPV: NEGATIVE
Neisseria Gonorrhea: NEGATIVE

## 2021-02-04 ENCOUNTER — Encounter (HOSPITAL_COMMUNITY): Payer: Self-pay

## 2021-02-04 ENCOUNTER — Other Ambulatory Visit: Payer: Self-pay

## 2021-02-04 ENCOUNTER — Emergency Department (HOSPITAL_COMMUNITY)
Admission: EM | Admit: 2021-02-04 | Discharge: 2021-02-04 | Disposition: A | Discharge status: Home / Self Care | Payer: Medicaid Other | Attending: Emergency Medicine | Admitting: Emergency Medicine

## 2021-02-04 ENCOUNTER — Emergency Department (HOSPITAL_COMMUNITY): Payer: Medicaid Other

## 2021-02-04 DIAGNOSIS — S99922A Unspecified injury of left foot, initial encounter: Secondary | ICD-10-CM | POA: Diagnosis present

## 2021-02-04 DIAGNOSIS — S92515A Nondisplaced fracture of proximal phalanx of left lesser toe(s), initial encounter for closed fracture: Secondary | ICD-10-CM

## 2021-02-04 DIAGNOSIS — W228XXA Striking against or struck by other objects, initial encounter: Secondary | ICD-10-CM | POA: Insufficient documentation

## 2021-02-04 MED ORDER — IBUPROFEN 600 MG PO TABS: 600.0000 mg | ORAL_TABLET | Freq: Three times a day (TID) | ORAL | 0 refills | Status: DC | PRN

## 2021-02-04 NOTE — ED Triage Notes (Signed)
Pt reports pain in r fourth toe approx 1 week ago after hitting it on a door.  Toe swollen.

## 2021-02-04 NOTE — ED Provider Notes (Addendum)
[facility] EMERGENCY DEPARTMENT Provider Note   CSN: [id number] Arrival date & time: 02/04/21  1638     History Chief Complaint  Patient presents with   Toe Pain    Mercedes Reynolds is a 30 y.o. female presenting for evaluation of persistent pain and swelling of her right fourth toe.  She describes stubbing the toe approximately 2 weeks ago against furniture, initially had significant pain and swelling of her third and fourth toes.  The third toe has improved but the fourth continues to be painful.  She is able to walk but it has been difficulty with any flexion of this toe.  She has used elevation and ice without improvement in her symptoms.  She denies any other injury.  The history is provided by the patient.      Past Medical History:  Diagnosis Date   Anxiety    Depression    PTSD (post-traumatic stress disorder)     Patient Active Problem List   Diagnosis Date Noted   History of abnormal cervical Pap smear 07/15/2020    Past Surgical History:  Procedure Laterality Date   TUBAL LIGATION       OB History     Gravida  3   Para  3   Term  3   Preterm      AB      Living  3      SAB      IAB      Ectopic      Multiple      Live Births  3           Family History  Problem Relation Age of Onset   Stroke Other    Diabetes Other     Social History   Tobacco Use   Smoking status: Some Days    Packs/day: 0.50    Years: 6.00    Pack years: 3.00    Types: Cigarettes   Smokeless tobacco: Never  Vaping Use   Vaping Use: Never used  Substance Use Topics   Alcohol use: Yes    Alcohol/week: 84.0 standard drinks    Types: 84 Cans of beer per week    Comment: occasionally, prior heavy use   Drug use: Yes    Types: Marijuana    Home Medications Prior to Admission medications   Medication Sig Start Date End Date Taking? Authorizing Provider  ibuprofen (ADVIL) 600 MG tablet Take 1 tablet (600 mg total) by mouth every 8 (eight) hours  as needed for moderate pain. 02/04/21  Yes Mikyle Sox, Raynelle Fanning, PA-C  doxycycline (VIBRAMYCIN) 100 MG capsule Take 1 capsule (100 mg total) by mouth 2 (two) times daily. Patient not taking: Reported on 07/15/2020 04/02/20   Anselm Pancoast, PA-C    Allergies    Patient has no known allergies.  Review of Systems   Review of Systems  Constitutional:  Negative for fever.  Musculoskeletal:  Positive for arthralgias and joint swelling. Negative for myalgias.  Neurological:  Negative for weakness and numbness.  All other systems reviewed and are negative.  Physical Exam Updated Vital Signs BP (!) 117/94 (BP Location: Right Arm)    Pulse 88    Temp 98.3 F (36.8 C) (Oral)    Resp 18    Ht 5\' 3"  (1.6 m)    Wt 49.9 kg    LMP 02/04/2021 (Exact Date)    SpO2 100%    BMI 19.49 kg/m   Physical Exam Constitutional:  Appearance: She is well-developed.  HENT:     Head: Atraumatic.  Cardiovascular:     Comments: Pulses equal bilaterally Musculoskeletal:        General: Tenderness present.     Cervical back: Normal range of motion.     Right foot: Decreased range of motion. Normal capillary refill. Swelling and bony tenderness present. No deformity.     Comments: Uniform edema and tenderness along the right fourth toe.  No obvious deformity.  Sensation is intact.  Dorsalis pedal pulses intact.  She has no foot or ankle pain.  Skin:    General: Skin is warm and dry.  Neurological:     Mental Status: She is alert.     Sensory: No sensory deficit.     Motor: No weakness.     Deep Tendon Reflexes: Reflexes normal.    ED Results / Procedures / Treatments   Labs (all labs ordered are listed, but only abnormal results are displayed) Labs Reviewed - No data to display  EKG None  Radiology DG Foot Complete Right  Result Date: 02/04/2021 CLINICAL DATA:  Right foot pain EXAM: RIGHT FOOT COMPLETE - 3+ VIEW COMPARISON:  None. FINDINGS: Acute oblique fracture through the shaft of the fourth proximal  phalanx without significant displacement. No additional fracture identified. No dislocation. Soft tissue swelling of the fourth toe. IMPRESSION: Acute fracture of the fourth proximal phalanx. Electronically Signed   By: Jannifer Hick M.D.   On: 02/04/2021 10:49    Procedures Procedures    SPLINT APPLICATION Date/Time: 11:08 AM Authorized by: Burgess Amor Consent: Verbal consent obtained. Risks and benefits: risks, benefits and alternatives were discussed Consent given by: patient Splint applied by: RN Location details: right 4th toe/foot Splint type: post op shoe, buddy tape Supplies used: tape, post op shoe Post-procedure: The splinted body part was neurovascularly unchanged following the procedure. Patient tolerance: Patient tolerated the procedure well with no immediate complications.   Medications Ordered in ED Medications - No data to display  ED Course  I have reviewed the triage vital signs and the nursing notes.  Pertinent labs & imaging results that were available during my care of the patient were reviewed by me and considered in my medical decision making (see chart for details).    MDM Rules/Calculators/A&P                         Imaging reviewed and discussed with patient.  She has a nondisplaced fracture of her right fourth proximal phalanx.  She was placed in a postop shoe after buddy taping the toe.  Home instructions were given and discussed.  Referral to Dr. Romeo Apple for follow-up care.    Final Clinical Impression(s) / ED Diagnoses Final diagnoses:  Nondisplaced fracture of proximal phalanx of left lesser toe(s), initial encounter for closed fracture    Rx / DC Orders ED Discharge Orders          Ordered    ibuprofen (ADVIL) 600 MG tablet  Every 8 hours PRN        02/04/21 1106             Burgess Amor, PA-C 02/04/21 1111    Burgess Amor, PA-C 02/04/21 1111    Pollyann Savoy, MD 02/05/21 [phone]

## 2021-02-04 NOTE — Discharge Instructions (Addendum)
Wear your postop shoe at all times when standing and walking.  You will need to keep your injured toe buddy taped to the next toe at all times, although you will probably want to change this tape daily to keep it clean and dry.  Call Dr. Romeo Apple for a recheck of your injury within the next week.  This injury should heal without difficulty or problems as long as you keep it immobilized while it continues to heal.

## 2021-12-01 IMAGING — DX DG ELBOW COMPLETE 3+V*L*
4 series · 4 of 4 positions shown · non-contrast
Comparison: Left elbow radiograph dated 06/25/2006.

CLINICAL DATA: 29-year-old female with left elbow pain and
swelling.

EXAM:
LEFT ELBOW - COMPLETE 3+ VIEW

[elbow lat]
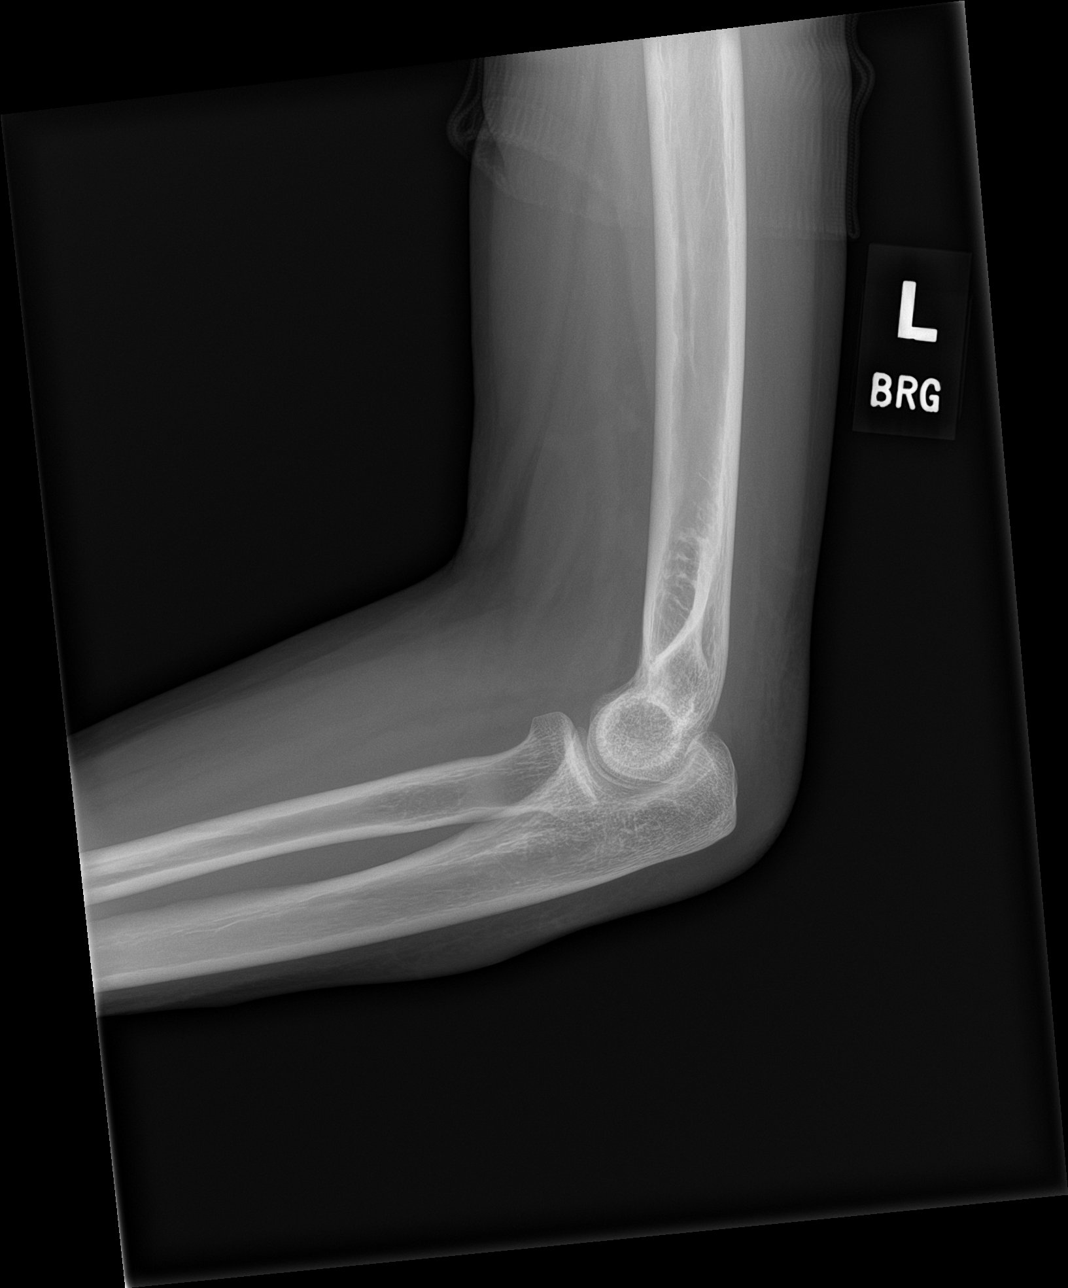

[elbow obl (1 of 2)]
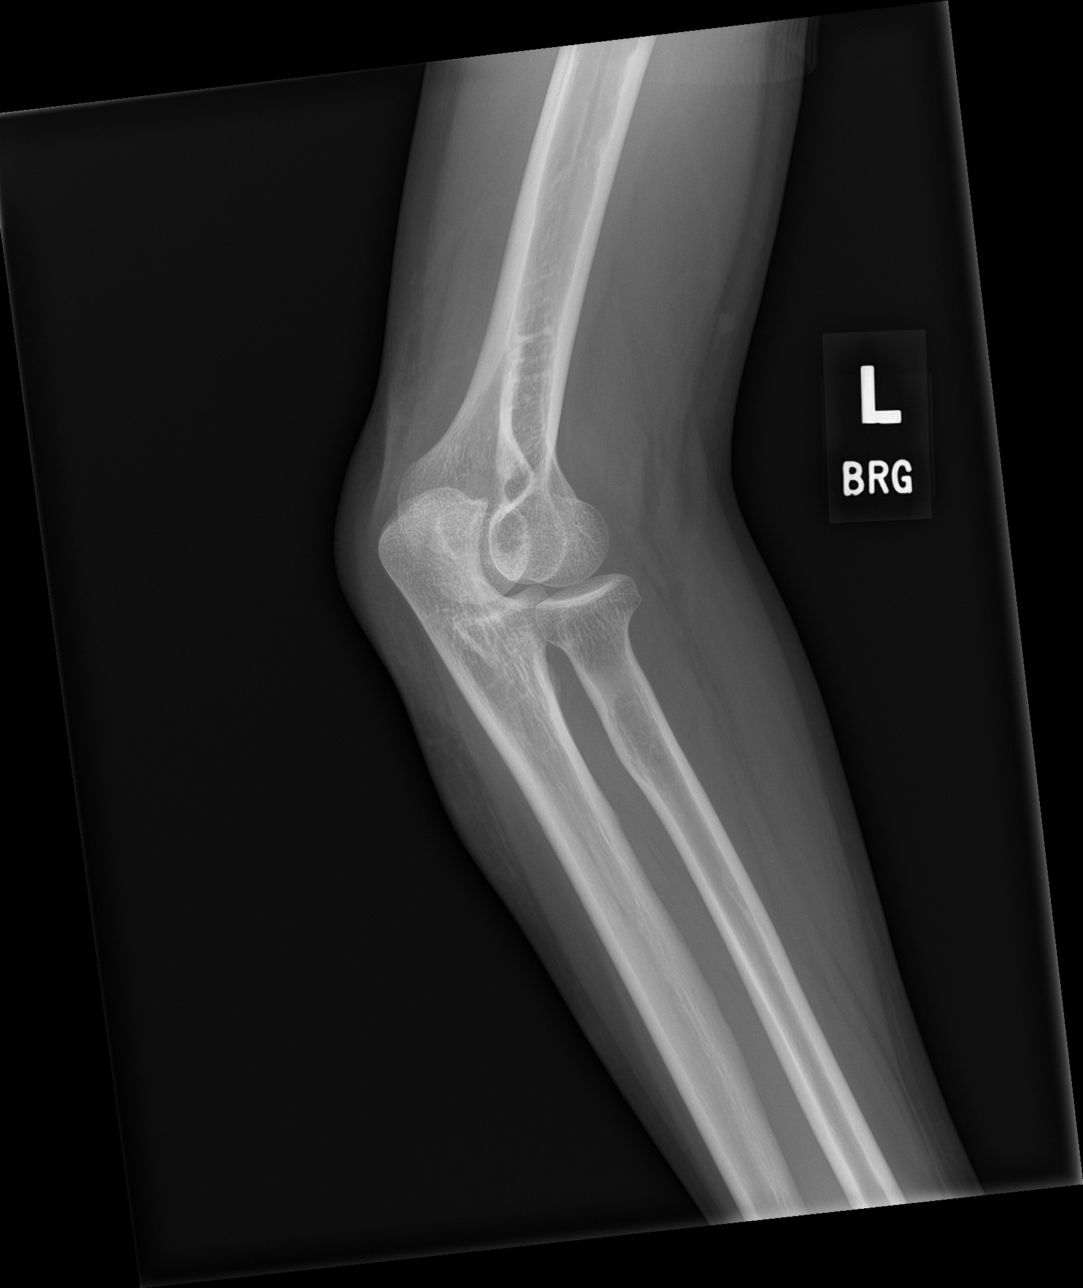

[elbow obl (2 of 2)]
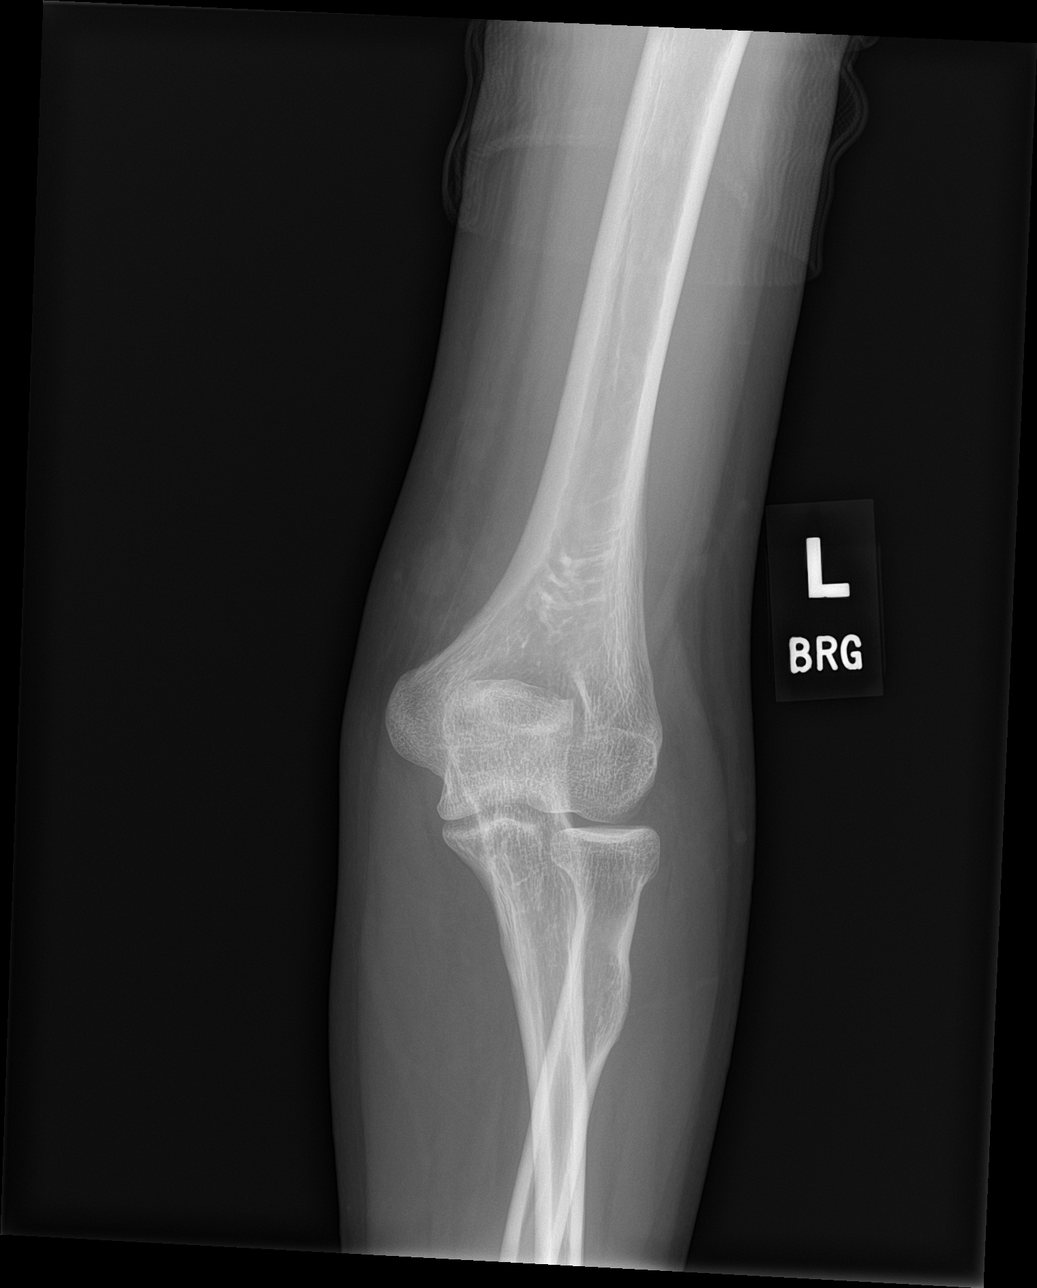

[elbow ap]
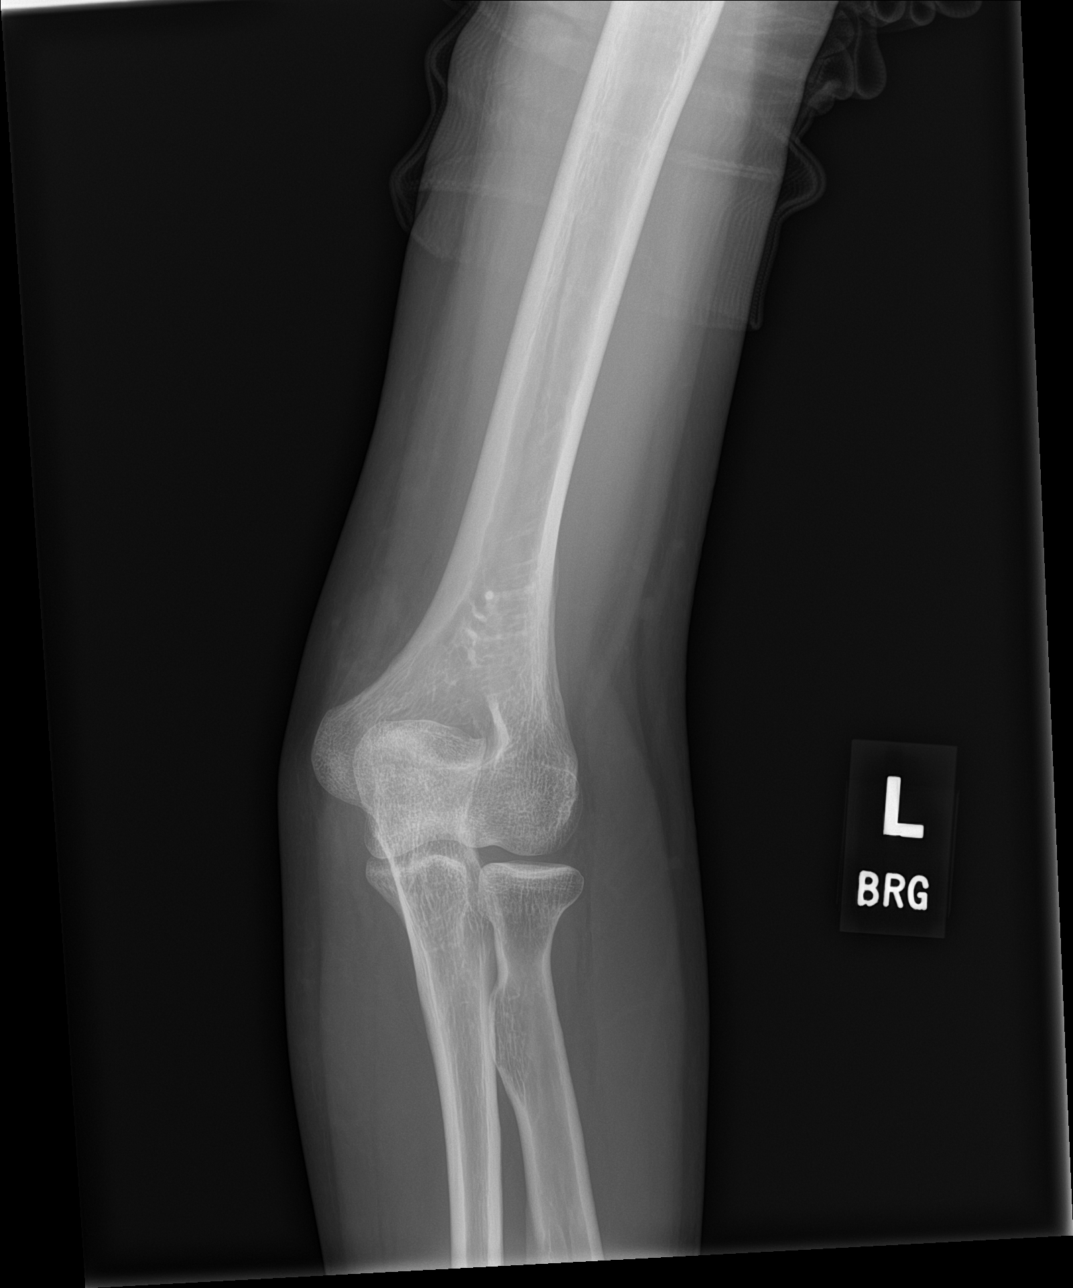

[4 of 4 positions shown; findings below may reference images not displayed]

FINDINGS: There is no acute fracture or dislocation. Evaluation however is
limited as a true 90 degree lateral view is not provided. No
significant joint effusion. Mild soft tissue thickening and
subcutaneous stranding of the dorsum of the elbow may represent
cellulitis or olecranon bursitis. No radiopaque foreign object or
soft tissue gas.
IMPRESSION: 1. No acute fracture or dislocation.
2. Findings may represent cellulitis of the dorsal elbow or
olecranon bursitis.

## 2022-10-05 IMAGING — DX DG FOOT COMPLETE 3+V*R*
3 series · 3 of 3 positions shown · non-contrast
Comparison: None.

CLINICAL DATA: Right foot pain

EXAM:
RIGHT FOOT COMPLETE - 3+ VIEW

[foot ap]
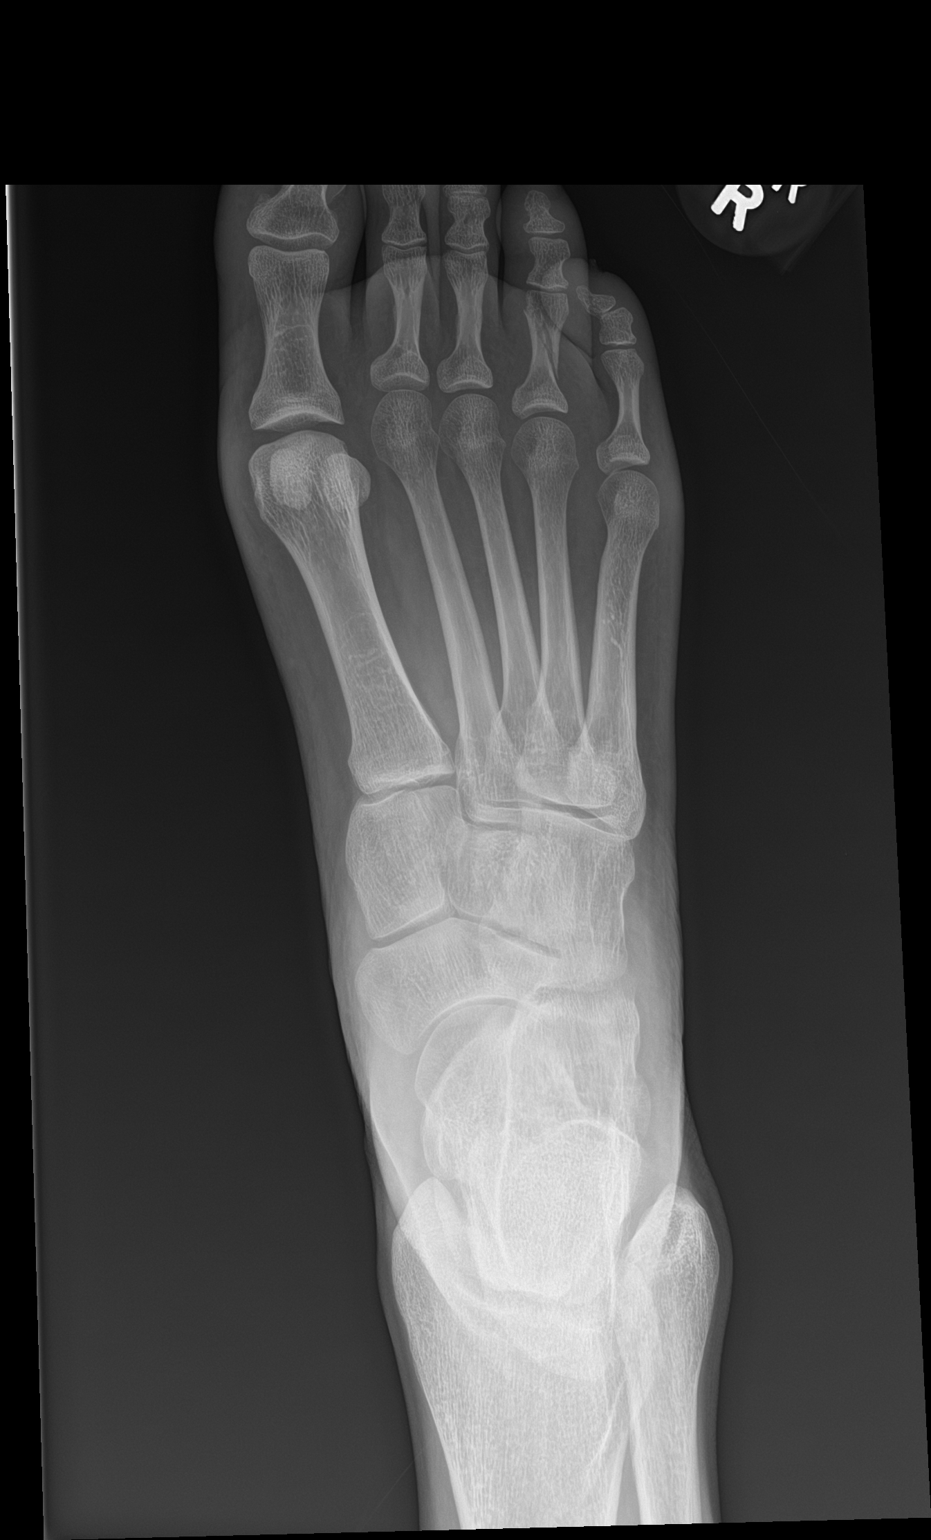

[foot obl]
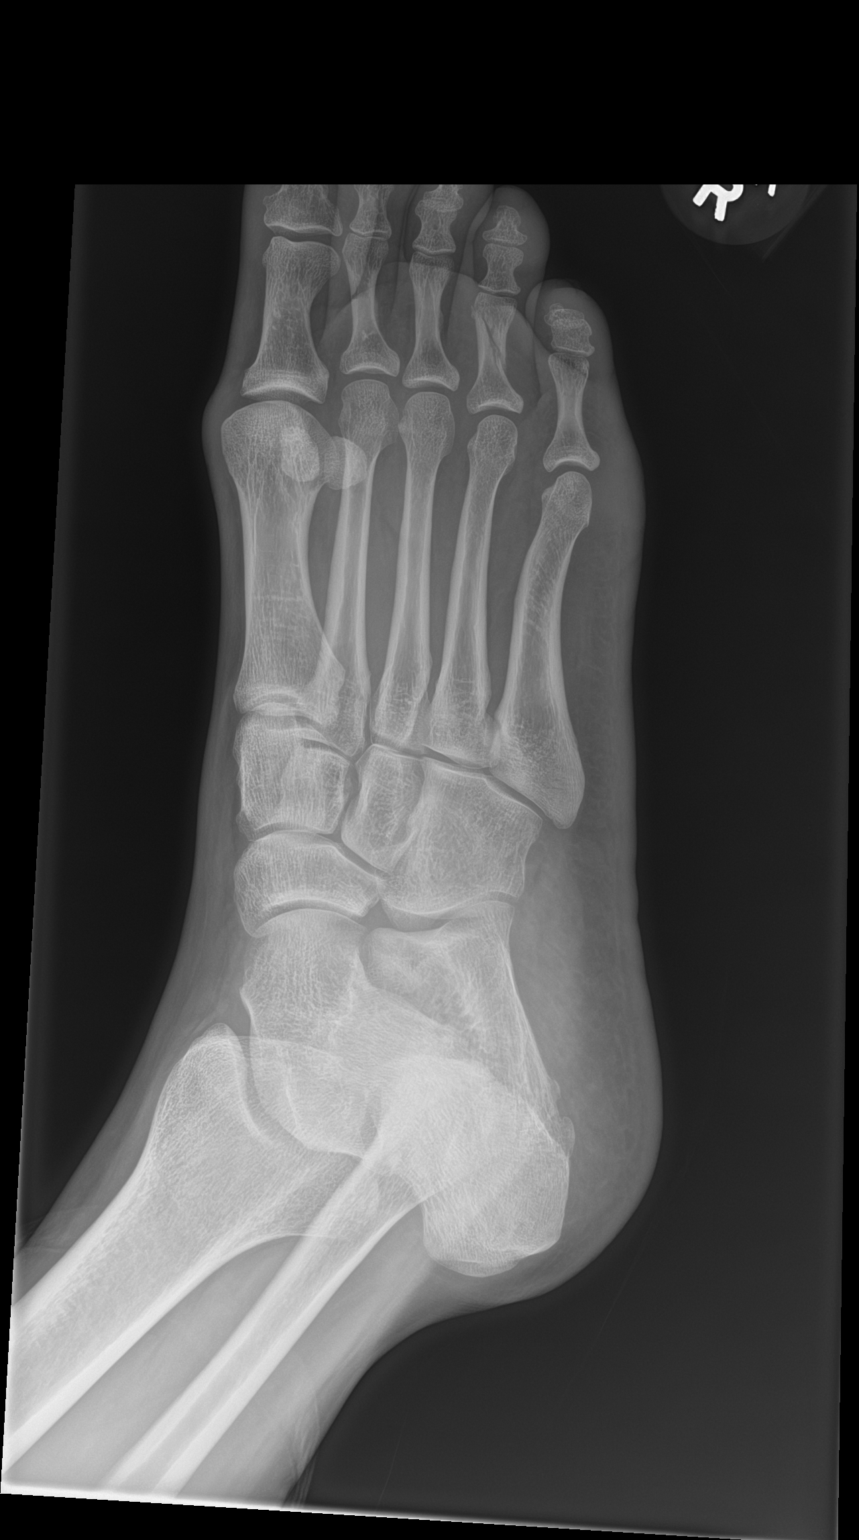

[foot lat]
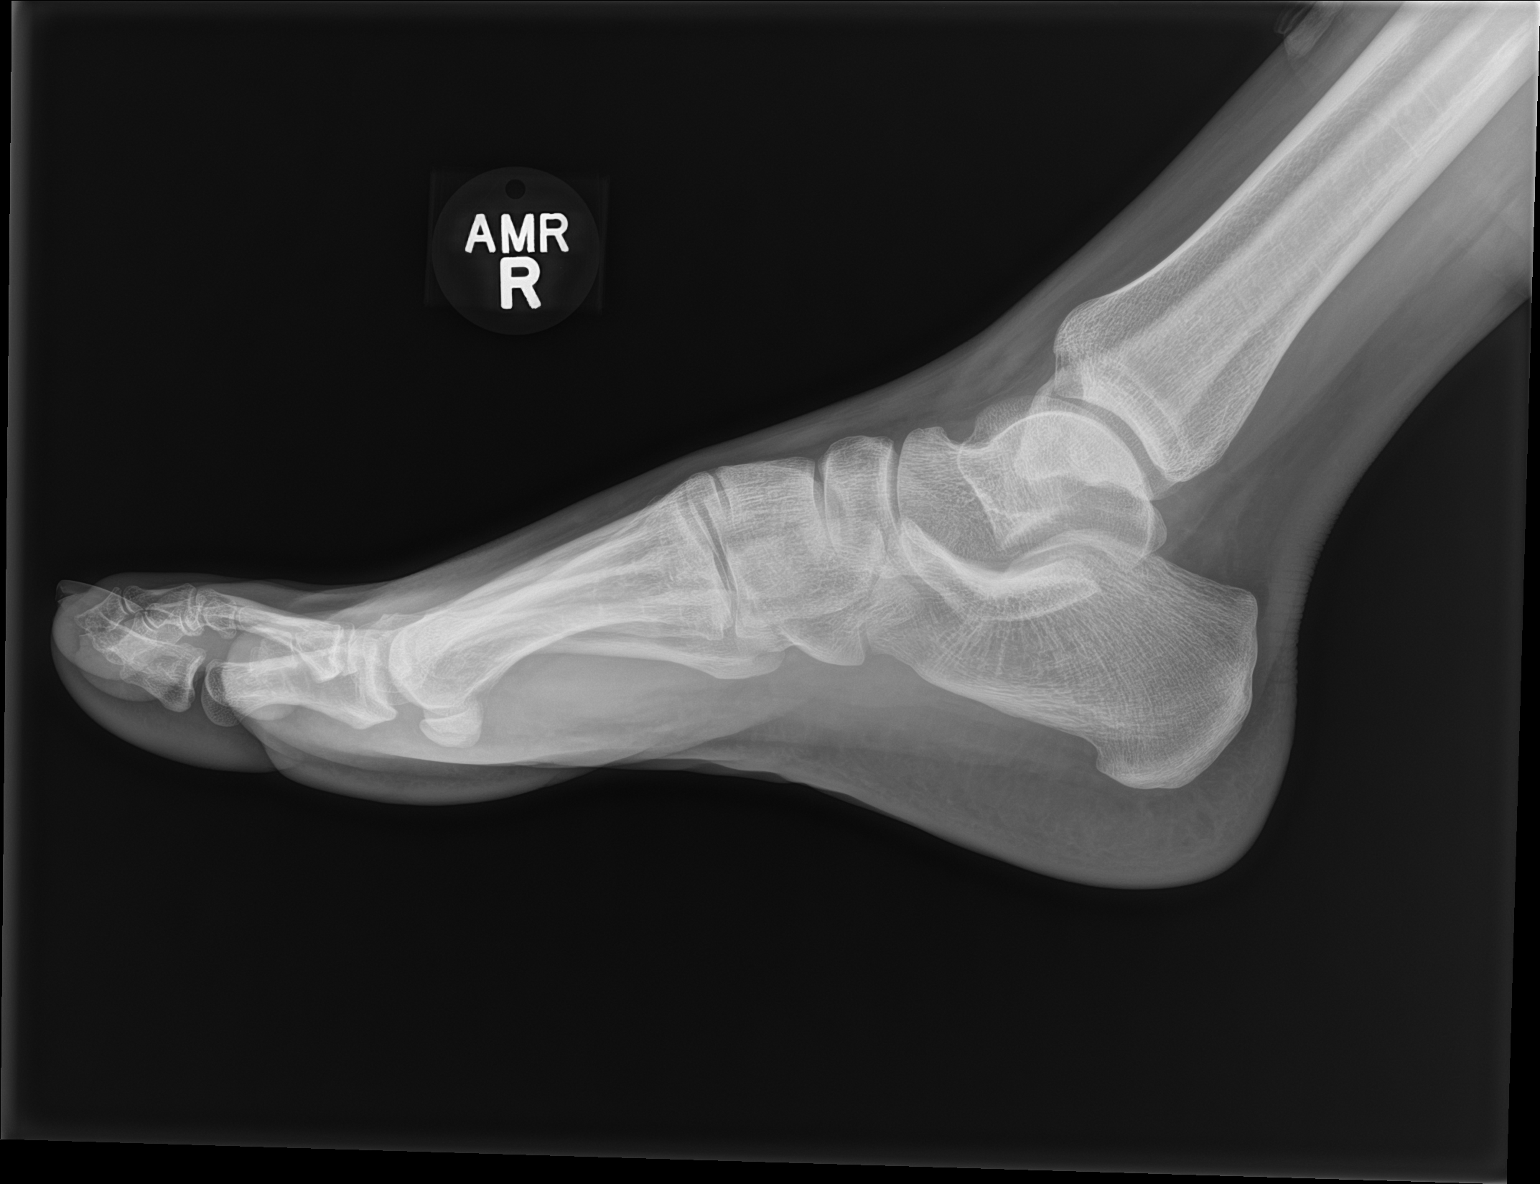

[3 of 3 positions shown; findings below may reference images not displayed]

FINDINGS: Acute oblique fracture through the shaft of the fourth proximal
phalanx without significant displacement. No additional fracture
identified. No dislocation. Soft tissue swelling of the fourth toe.
IMPRESSION: Acute fracture of the fourth proximal phalanx.

## 2023-01-09 ENCOUNTER — Ambulatory Visit (INDEPENDENT_AMBULATORY_CARE_PROVIDER_SITE_OTHER): Payer: MEDICAID | Admitting: Obstetrics & Gynecology

## 2023-01-09 ENCOUNTER — Other Ambulatory Visit (HOSPITAL_COMMUNITY)
Admission: RE | Admit: 2023-01-09 | Discharge: 2023-01-09 | Disposition: A | Discharge status: Home / Self Care | Payer: MEDICAID | Source: Ambulatory Visit | Attending: Obstetrics & Gynecology | Admitting: Obstetrics & Gynecology

## 2023-01-09 ENCOUNTER — Encounter: Payer: Self-pay | Admitting: Obstetrics & Gynecology

## 2023-01-09 VITALS — BP 120/74 | HR 61 | Ht 63.5 in | Wt 115.4 lb

## 2023-01-09 DIAGNOSIS — Z113 Encounter for screening for infections with a predominantly sexual mode of transmission: Secondary | ICD-10-CM | POA: Insufficient documentation

## 2023-01-09 DIAGNOSIS — Z3009 Encounter for other general counseling and advice on contraception: Secondary | ICD-10-CM

## 2023-01-09 DIAGNOSIS — N898 Other specified noninflammatory disorders of vagina: Secondary | ICD-10-CM

## 2023-01-09 DIAGNOSIS — N907 Vulvar cyst: Secondary | ICD-10-CM | POA: Diagnosis not present

## 2023-01-09 NOTE — Progress Notes (Signed)
   GYN VISIT Patient name: Mercedes Reynolds ELIE MRN [id number]  Date of birth: Sep 09, 1990 Chief Complaint:   No chief complaint on file.  History of Present Illness:   Mercedes Reynolds is a 32 y.o. [phone]  female being seen today to discuss the following concerns:  -Fertility- pt underwent tubal ligation and desires reversal.  She wants more children.   She notes that she has been actively "trying" for the past 4 yrs.  Pt was able to conceive naturally s/p BTL in 2020; however, she had a termination as she did not want the pregnancy at that time.  She was interested in discussing a tubal reversal  Vaginal discharge: Yellow odorous discharge x 1 mos- notes copious amount.  Reports new partner and desires STI screening.  She has not taken any medication.  She denies itching or pelvic pain  Notes a bump on near her vagina.  Feels like it has gotten bigger.   States she had a prior provider look at it and was told it was benign however she wishes to have it removed because she finds it embarrassing   Review of Systems:   Pertinent items are noted in HPI Denies fever/chills, dizziness, headaches, visual disturbances, fatigue, shortness of breath, chest pain, abdominal pain, vomiting, no problems with periods, bowel movements, urination, or intercourse unless otherwise stated above.  Pertinent History Reviewed:   Past Surgical History:  Procedure Laterality Date   TUBAL LIGATION      Past Medical History:  Diagnosis Date   Anxiety    Depression    PTSD (post-traumatic stress disorder)    Reviewed problem list, medications and allergies. Physical Assessment:   Vitals:   01/09/23 1125  BP: 120/74  Pulse: 61  Weight: 115 lb 6.4 oz (52.3 kg)  Height: 5' 3.5" (1.613 m)  Body mass index is 20.12 kg/m.       Physical Examination:   General appearance: alert, well appearing, and in no distress  Psych: mood appropriate, normal affect  Skin: warm & dry   Cardiovascular: normal heart  rate noted  Respiratory: normal respiratory effort, no distress  Abdomen: soft, non-tender   Pelvic: VULVA: On left labia majora area approximately 1 mm lesion  slightly raised with white central appearance-nontender, no induration, no erythema.  No other abnormalities noted normal appearing vulva with no masses, tenderness or lesions, VAGINA: normal appearing vagina with normal color and discharge, no lesions.  Normal appearing cervix, no lesions or discharge  Extremities: no edema   Chaperone: Faith Rogue    Assessment & Plan:  1) vaginal discharge -Vaginitis panel obtained, further management pending results -Will also complete STI workup  2) Family-planning -Discussed with patient that typically since ART/IVF has become so successful tubal reversals have come out of practice -Recommended referral to [facility] for further information -Discussed that typically Medicaid does not cover these types of procedures -Patient declined referral at this time due to financial concerns  3) Vulvar cyst -Based on the size and nature of her cyst did not think surgical removal was indicated -reviewed conservative measures -Patient to follow-up if cyst increases in size   Orders Placed This Encounter  Procedures   RPR   HIV Antibody (routine testing w rflx)    Return in about 4 weeks (around 02/06/2023) for 3-4wk if needed.   Myna Hidalgo, DO Attending Obstetrician & Gynecologist, [facility] for [employer], [facility]

## 2023-01-09 NOTE — Addendum Note (Signed)
Addended by: Annamarie Dawley on: 01/09/2023 01:31 PM   Modules accepted: Orders

## 2023-01-10 LAB — HIV ANTIBODY (ROUTINE TESTING W REFLEX): HIV Screen 4th Generation wRfx: NONREACTIVE

## 2023-01-10 LAB — RPR: RPR Ser Ql: NONREACTIVE

## 2023-01-11 ENCOUNTER — Other Ambulatory Visit: Payer: Self-pay | Admitting: Obstetrics & Gynecology

## 2023-01-11 DIAGNOSIS — A599 Trichomoniasis, unspecified: Secondary | ICD-10-CM

## 2023-01-11 DIAGNOSIS — B9689 Other specified bacterial agents as the cause of diseases classified elsewhere: Secondary | ICD-10-CM

## 2023-01-11 LAB — CERVICOVAGINAL ANCILLARY ONLY
Bacterial Vaginitis (gardnerella): POSITIVE — AB
Candida Glabrata: NEGATIVE
Candida Vaginitis: NEGATIVE
Chlamydia: NEGATIVE
Comment: NEGATIVE
Comment: NEGATIVE
Comment: NEGATIVE
Comment: NEGATIVE
Comment: NEGATIVE
Comment: NORMAL
Neisseria Gonorrhea: NEGATIVE
Trichomonas: POSITIVE — AB

## 2023-01-11 MED ORDER — METRONIDAZOLE 500 MG PO TABS: 500.0000 mg | ORAL_TABLET | Freq: Two times a day (BID) | ORAL | 1 refills | Status: AC

## 2023-01-11 NOTE — Progress Notes (Signed)
Treatment for Trich and BV  Myna Hidalgo, DO Attending Obstetrician & Gynecologist, [facility] for [employer], [facility]
